# Patient Record
Sex: Female | Born: 1955
Health system: Southern US, Community
[De-identification: ages and names within clinical notes are randomized; demographics above are authoritative.]

## PROBLEM LIST (undated history)

## (undated) DIAGNOSIS — E119 Type 2 diabetes mellitus without complications: Secondary | ICD-10-CM

## (undated) DIAGNOSIS — H269 Unspecified cataract: Secondary | ICD-10-CM

## (undated) DIAGNOSIS — J189 Pneumonia, unspecified organism: Secondary | ICD-10-CM

## (undated) DIAGNOSIS — H539 Unspecified visual disturbance: Secondary | ICD-10-CM

## (undated) HISTORY — DX: Unspecified visual disturbance: H53.9

## (undated) HISTORY — DX: Unspecified cataract: H26.9

## (undated) HISTORY — DX: Pneumonia, unspecified organism: J18.9

## (undated) HISTORY — PX: CYST REMOVAL: SHX22

---

## 2018-09-27 DIAGNOSIS — L309 Dermatitis, unspecified: Secondary | ICD-10-CM | POA: Insufficient documentation

## 2018-10-08 ENCOUNTER — Other Ambulatory Visit (INDEPENDENT_AMBULATORY_CARE_PROVIDER_SITE_OTHER): Payer: Self-pay | Admitting: Family Medicine

## 2018-10-12 DIAGNOSIS — Z6841 Body Mass Index (BMI) 40.0 and over, adult: Secondary | ICD-10-CM | POA: Insufficient documentation

## 2018-10-16 ENCOUNTER — Other Ambulatory Visit: Payer: Self-pay | Admitting: Family Medicine

## 2018-10-23 ENCOUNTER — Other Ambulatory Visit: Payer: Self-pay | Admitting: Otolaryngology

## 2019-01-03 ENCOUNTER — Encounter (INDEPENDENT_AMBULATORY_CARE_PROVIDER_SITE_OTHER): Payer: Self-pay

## 2019-01-03 ENCOUNTER — Ambulatory Visit (INDEPENDENT_AMBULATORY_CARE_PROVIDER_SITE_OTHER): Payer: BLUE CROSS/BLUE SHIELD | Admitting: Family Medicine

## 2019-01-03 VITALS — BP 132/84 | HR 80 | Temp 98.3°F | Resp 16 | Ht 70.0 in | Wt 288.0 lb

## 2019-01-03 DIAGNOSIS — Z113 Encounter for screening for infections with a predominantly sexual mode of transmission: Secondary | ICD-10-CM

## 2019-01-03 DIAGNOSIS — N760 Acute vaginitis: Secondary | ICD-10-CM

## 2019-01-03 DIAGNOSIS — E1169 Type 2 diabetes mellitus with other specified complication: Secondary | ICD-10-CM | POA: Insufficient documentation

## 2019-01-03 DIAGNOSIS — E119 Type 2 diabetes mellitus without complications: Secondary | ICD-10-CM | POA: Insufficient documentation

## 2019-01-03 DIAGNOSIS — B9689 Other specified bacterial agents as the cause of diseases classified elsewhere: Secondary | ICD-10-CM

## 2019-01-03 MED ORDER — METRONIDAZOLE 500 MG PO TABS
500.00 mg | ORAL_TABLET | Freq: Two times a day (BID) | ORAL | 0 refills | Status: AC
Start: 2019-01-03 — End: 2019-01-10

## 2019-01-03 NOTE — Patient Instructions (Addendum)
Start Flagyl (Metornidizole) for you vaginal discharge which is likely due to bacterial vaginosis  Orders provided for STD testing.   We will call you with the results of the test.

## 2019-01-03 NOTE — Progress Notes (Signed)
[facility] [place] - [facility] [place] [facility]                       Date of Exam: 01/03/2019 12:57 PM        Patient ID: Gabriella Dean is a 63 y.o. female.  Attending Physician: PP Cory Roughen IN MD        Chief Complaint:    Chief Complaint   Patient presents with   . Vaginal Discharge               HPI:    63yo F with known T2DM and goiter presents with increased vaginal discharge that is a little bite thicker and slight foul odor compared to normal for the past 4-5 days. She has been treated with Flagyl prior for increased discharge and this is similar to that episode.   Her partner has been unfaithful, she is unsure when he was unfaithful.   Denies dysuria, dyspareunia, fatigue.   She would like STD testing.   Denies hepatic like lesions.     She has a pending COVID19 test, which was ordered for sweating. No cough, F/C or SOB.         ROS:    Review of Systems   Constitutional: Negative for fatigue.   Genitourinary: Positive for vaginal discharge. Negative for dyspareunia, dysuria, frequency, genital sores, pelvic pain, urgency and vaginal pain.                Problem List:    Patient Active Problem List   Diagnosis   . Type 2 diabetes mellitus with other specified complication, without long-term current use of insulin             Current Meds:    No outpatient medications have been marked as taking for the 01/03/19 encounter (Office Visit) with PP Cory Roughen IN MD.          Allergies:    Allergies   Allergen Reactions   . Chlorpheniramine Nausea And Vomiting and Rash   . Erythromycin Nausea And Vomiting and Rash   . Penicillins Nausea And Vomiting and Rash   . Tetracycline Nausea And Vomiting and Rash           Past Surgical History:    History reviewed. No pertinent surgical history.        Family History:    History reviewed. No pertinent family history.        Social History:    Social History     Tobacco Use   . Smoking status: Never Smoker   . Smokeless tobacco: Never Used    Substance Use Topics   . Alcohol use: Yes     Comment: socially   . Drug use: Not on file          The following sections were reviewed this encounter by the provider:   Tobacco  Allergies  Meds  Problems  Med Hx  Surg Hx  Fam Hx            Vital Signs:    BP 132/84   Pulse 80   Temp 98.3 F (36.8 C)   Resp 16   Ht 1.778 m (5\' 10" )   Wt 130.6 kg (288 lb)   BMI 41.32 kg/m             Physical Exam:    Physical Exam  Vitals signs and nursing note reviewed.   Constitutional:  General: She is not in acute distress.     Appearance: Normal appearance. She is not toxic-appearing.   HENT:      Head: Normocephalic and atraumatic.      Nose: Nose normal.      Mouth/Throat:      Mouth: Mucous membranes are moist.   Eyes:      General: No scleral icterus.        Right eye: No discharge.         Left eye: No discharge.      Extraocular Movements: Extraocular movements intact.      Conjunctiva/sclera: Conjunctivae normal.   Pulmonary:      Effort: Pulmonary effort is normal.   Neurological:      Mental Status: She is alert.   Psychiatric:         Mood and Affect: Mood normal.         Behavior: Behavior normal.         Thought Content: Thought content normal.         Judgment: Judgment normal.              Assessment:    1. Screening for venereal disease  - HIV-1/2 Ag/Ab 4th Gen. w/ Reflex  - Hepatitis C (HCV) antibody, Total  - RPR  - Chlamydia Gonorrhoeae NAA    2. Bacterial vaginosis  - metroNIDAZOLE (FLAGYL) 500 MG tablet; Take 1 tablet (500 mg total) by mouth 2 (two) times daily for 7 days  Dispense: 14 tablet; Refill: 0    3. Type 2 diabetes mellitus with other specified complication, without long-term current use of insulin            Plan:    Patient deferred pelvic exam, which I am fine with as I suspect Bacterial vaginosis v Trichomonas and management will not change with the exam.   Will treat her empirically with flagyl and STI testing.   Patient voiced understanding and agreement with the plan.   All questions answered.  A minimum of 25 minutes was spent with the patient and greater than 50% was spent coordinating care or counseling the patient.          Follow-up:    Return if symptoms worsen or fail to improve.         PP [facility] MD

## 2019-01-08 LAB — COMPREHENSIVE METABOLIC PANEL
ALT: 11 IU/L (ref 0–32)
AST (SGOT): 13 IU/L (ref 0–40)
Albumin/Globulin Ratio: 1.3 (ref 1.2–2.2)
Albumin: 4.4 g/dL (ref 3.8–4.8)
Alkaline Phosphatase: 86 IU/L (ref 39–117)
BUN / Creatinine Ratio: 14 (ref 12–28)
BUN: 9 mg/dL (ref 8–27)
Bilirubin, Total: 0.4 mg/dL (ref 0.0–1.2)
CO2: 23 mmol/L (ref 20–29)
Calcium: 10 mg/dL (ref 8.7–10.3)
Chloride: 99 mmol/L (ref 96–106)
Creatinine: 0.64 mg/dL (ref 0.57–1.00)
EGFR: 110 mL/min/{1.73_m2} (ref >59)
EGFR: 95 mL/min/{1.73_m2} (ref >59)
Globulin, Total: 3.3 g/dL (ref 1.5–4.5)
Glucose: 137 mg/dL — ABNORMAL HIGH (ref 65–99)
Potassium: 4.5 mmol/L (ref 3.5–5.2)
Protein, Total: 7.7 g/dL (ref 6.0–8.5)
Sodium: 139 mmol/L (ref 134–144)

## 2019-01-08 LAB — THYROID PEROXIDASE ANTIBODY: Thyroid Peroxidase (TPO) AB: 12 IU/mL (ref 0–34)

## 2019-01-08 LAB — THYROID SCREEN
T4, Free: 1.17 ng/dL (ref 0.82–1.77)
TSH: 0.526 u[IU]/mL (ref 0.450–4.500)

## 2019-01-08 LAB — HEMOGLOBIN A1C: Hemoglobin A1C: 8 % — ABNORMAL HIGH (ref 4.8–5.6)

## 2019-01-09 LAB — RPR: RPR: NONREACTIVE

## 2019-01-09 LAB — HEPATITIS C ANTIBODY: HCV AB: <0.1 s/co ratio (ref 0.0–0.9)

## 2019-01-09 LAB — CHLAMYDIA GONORRHOEAE NAA
CHLAMYDIA TRACHOMATIS, NAA: NEGATIVE
Neisseria gonorrhoeae, NAA: NEGATIVE

## 2019-01-09 LAB — HIV-1/2 AG/AB 4TH GEN. W/ REFLEX: HIV Screen 4th Generation wRfx: NONREACTIVE

## 2019-01-11 ENCOUNTER — Encounter (INDEPENDENT_AMBULATORY_CARE_PROVIDER_SITE_OTHER): Payer: Self-pay

## 2019-01-11 NOTE — Progress Notes (Signed)
Case discussed/reviewed in the immediate proximity of the resident visit.  Chart reviewed to include allergies, medications, and pertinent past medical history to the nature of the visit.  I have reviewed the described history and exam as described and concur with the note as written.  Based on our mutual discussion, I agree with the assessment and plan described.    Teletha Petrea, MD  Faculy Physician, [facility]

## 2019-01-25 ENCOUNTER — Ambulatory Visit (INDEPENDENT_AMBULATORY_CARE_PROVIDER_SITE_OTHER): Payer: BLUE CROSS/BLUE SHIELD | Admitting: Family Medicine

## 2019-02-20 ENCOUNTER — Ambulatory Visit (INDEPENDENT_AMBULATORY_CARE_PROVIDER_SITE_OTHER): Payer: BLUE CROSS/BLUE SHIELD | Admitting: Family Medicine

## 2019-03-09 ENCOUNTER — Encounter (INDEPENDENT_AMBULATORY_CARE_PROVIDER_SITE_OTHER): Payer: Self-pay | Admitting: Family Medicine

## 2019-03-11 ENCOUNTER — Encounter (INDEPENDENT_AMBULATORY_CARE_PROVIDER_SITE_OTHER): Payer: Self-pay

## 2019-03-11 ENCOUNTER — Ambulatory Visit (INDEPENDENT_AMBULATORY_CARE_PROVIDER_SITE_OTHER): Payer: BLUE CROSS/BLUE SHIELD | Admitting: Family Medicine

## 2019-03-11 ENCOUNTER — Encounter (INDEPENDENT_AMBULATORY_CARE_PROVIDER_SITE_OTHER): Payer: Self-pay | Admitting: Family Medicine

## 2019-03-11 VITALS — BP 140/78 | HR 84 | Temp 98.9°F | Resp 16 | Ht 70.0 in | Wt 290.0 lb

## 2019-03-11 DIAGNOSIS — Z1211 Encounter for screening for malignant neoplasm of colon: Secondary | ICD-10-CM

## 2019-03-11 DIAGNOSIS — Z23 Encounter for immunization: Secondary | ICD-10-CM

## 2019-03-11 DIAGNOSIS — E1169 Type 2 diabetes mellitus with other specified complication: Secondary | ICD-10-CM

## 2019-03-11 DIAGNOSIS — R030 Elevated blood-pressure reading, without diagnosis of hypertension: Secondary | ICD-10-CM

## 2019-03-11 DIAGNOSIS — Z1239 Encounter for other screening for malignant neoplasm of breast: Secondary | ICD-10-CM

## 2019-03-11 DIAGNOSIS — Z13220 Encounter for screening for lipoid disorders: Secondary | ICD-10-CM

## 2019-03-11 DIAGNOSIS — E049 Nontoxic goiter, unspecified: Secondary | ICD-10-CM

## 2019-03-11 LAB — POCT MICROALBUMIN: Microalbumin, POC: 10

## 2019-03-11 LAB — POCT HEMOGLOBIN A1C: POCT Hgb A1C: 6.4 % — AB (ref 3.9–5.9)

## 2019-03-11 NOTE — Progress Notes (Signed)
Subjective:      Patient ID: Gabriella Dean is a 63 y.o. female.    Chief Complaint:  Chief Complaint   Patient presents with   . Diabetes       HPI:  The patient presents for follow up.  Issues include:    1. Type 2 Diabetes. She reports that she's been "working hard" to try to lower her A1C by exercising more and by eating a healthier diet.  She denies polyuria, polydipsia or polyphagia.  She is currently not on any medications for her diabetes.    2.  Goiter.  She has a goiter and has had a fine needle aspirate earlier this year which was benign.  She reports that she has been having some hot flashes recently.  She states that the hot flashes got better after taking some B vitamins.  No heat or cold intolerance, no constipation or diarrhea.    She hasn't really taken care of herself for the last 5-10 years and has focused on taking care of others.  She hasn't had a mammogram or a pap smear in years.  She doesn't recall her last tetanus shot and she has never had a pneumovax.  She hasn't had a colonoscopy.        Problem List:  Patient Active Problem List   Diagnosis   . Type 2 diabetes mellitus with other specified complication, without long-term current use of insulin   . Goiter   . Eczema   . Body mass index (BMI) 40.0-44.9, adult       Current Medications:  No current outpatient medications on file.     No current facility-administered medications for this visit.        Allergies:  Allergies   Allergen Reactions   . Chlorpheniramine Nausea And Vomiting and Rash   . Erythromycin Nausea And Vomiting and Rash   . Penicillins Nausea And Vomiting and Rash   . Tetracycline Nausea And Vomiting and Rash       Past Medical History:  No past medical history on file.    Past Surgical History:  No past surgical history on file.    Family History:  Family History   Problem Relation Age of Onset   . COPD Mother         Smoker   . Hypertension Mother    . Cancer Father    . Diabetes Father    . Diabetes Sister    .  Diabetes Brother    . Stroke Brother    . Prostate cancer Brother    . Coronary artery disease Maternal Grandmother    . Lung cancer Maternal Grandfather    . Cerebral aneurysm Paternal Grandmother    . Diabetes Brother    . Heart disease Brother    . Stent Brother        Social History:  Social History     Socioeconomic History   . Marital status: Single     Spouse name: Not on file   . Number of children: Not on file   . Years of education: Not on file   . Highest education level: Not on file   Occupational History   . Not on file   Social Needs   . Financial resource strain: Not on file   . Food insecurity     Worry: Not on file     Inability: Not on file   . Transportation needs     Medical: Not  on file     Non-medical: Not on file   Tobacco Use   . Smoking status: Never Smoker   . Smokeless tobacco: Never Used   Substance and Sexual Activity   . Alcohol use: Yes     Comment: socially   . Drug use: Not on file   . Sexual activity: Not on file   Lifestyle   . Physical activity     Days per week: Not on file     Minutes per session: Not on file   . Stress: Not on file   Relationships   . Social Wellsite geologist on phone: Not on file     Gets together: Not on file     Attends religious service: Not on file     Active member of club or organization: Not on file     Attends meetings of clubs or organizations: Not on file     Relationship status: Not on file   . Intimate partner violence     Fear of current or ex partner: Not on file     Emotionally abused: Not on file     Physically abused: Not on file     Forced sexual activity: Not on file   Other Topics Concern   . Not on file   Social History Narrative   . Not on file       The following sections were reviewed this encounter by the provider:   Tobacco  Allergies  Meds  Problems  Med Hx  Surg Hx  Fam Hx         ROS:  Review of Systems   Constitutional: Negative for activity change, appetite change, fatigue and unexpected weight change.   HENT: Negative  for congestion and ear pain.    Eyes: Negative for visual disturbance.   Respiratory: Negative for chest tightness and shortness of breath.    Cardiovascular: Negative for chest pain, palpitations and leg swelling.   Gastrointestinal: Negative for abdominal pain, nausea and vomiting.   Endocrine: Negative for cold intolerance, heat intolerance, polydipsia, polyphagia and polyuria.   Musculoskeletal: Negative for arthralgias and myalgias.   Skin: Negative for rash.   Neurological: Negative for dizziness, weakness, light-headedness, numbness and headaches.   Psychiatric/Behavioral: Negative for dysphoric mood.       Vitals:  BP 140/78 (BP Site: Left arm, Patient Position: Sitting, Cuff Size: Medium)   Pulse 84   Temp 98.9 F (37.2 C)   Resp 16   Ht 1.778 m (5\' 10" )   Wt 131.5 kg (290 lb)   BMI 41.61 kg/m      Objective:     Physical Exam:  Physical Exam  Constitutional:       Appearance: She is well-developed and overweight.   HENT:      Head: Normocephalic and atraumatic.   Eyes:      Extraocular Movements: Extraocular movements intact.      Pupils: Pupils are equal, round, and reactive to light.      Funduscopic exam:     Right eye: No hemorrhage, exudate, AV nicking or arteriolar narrowing.         Left eye: No hemorrhage, exudate, AV nicking or arteriolar narrowing.   Neck:      Musculoskeletal: Normal range of motion.      Thyroid: No thyroid mass or thyromegaly.      Vascular: No carotid bruit or JVD.   Cardiovascular:      Rate  and Rhythm: Normal rate and regular rhythm.      Pulses: Normal pulses.      Heart sounds: Normal heart sounds. No murmur. No friction rub. No gallop.    Pulmonary:      Effort: Pulmonary effort is normal.      Breath sounds: No wheezing, rhonchi or rales.   Abdominal:      Palpations: Abdomen is soft.      Tenderness: There is no abdominal tenderness.   Musculoskeletal:      Right lower leg: No edema.      Left lower leg: No edema.      Right foot: No Charcot foot.      Left  foot: No Charcot foot.   Feet:      Right foot:      Skin integrity: Skin integrity normal.      Toenail Condition: Right toenails are normal.      Left foot:      Skin integrity: Skin integrity normal.      Toenail Condition: Left toenails are normal.   Lymphadenopathy:      Cervical: No cervical adenopathy.   Skin:     Findings: No rash.   Neurological:      Mental Status: She is alert and oriented to person, place, and time.      Cranial Nerves: Cranial nerves are intact.      Motor: Motor function is intact.      Deep Tendon Reflexes: Reflexes are normal and symmetric.      Comments: Normal monofilament test in feet bilaterally.   Psychiatric:         Mood and Affect: Mood and affect normal.          Assessment:     1. Type 2 diabetes mellitus with other specified complication, without long-term current use of insulin  - POCT microalbumin  - POCT Hemoglobin A1C    2. Screening for lipid disorders  - Lipid panel    3. Screening for breast cancer  - Mammo Digital Screening Bilateral W Cad; Future  - Mammo Digital Screening Bilateral W Cad    4. Immunization due  - Flu vaccine QUAD 6 mos & up PRESERVED  - Pneumococcal polysaccharide vaccine 23-valent greater than or equal to 2yo subcutaneous/IM    5. Screen for colon cancer  - Ambulatory referral to Gastroenterology    6. breast cancer screening  - Mammo Digital Screening Bilateral W Cad; Future  - Mammo Digital Screening Bilateral W Cad    7. Goiter  - TSH    8. Elevated blood pressure reading in office without diagnosis of hypertension      Plan:     1. Microalbumin done.  2. A1C done.  3. Mammogram ordered, referred for colonoscopy.  4. Flu and Pneumovax given.  5. Remainder of labs as above, patient will be contacted with results.  6. Follow up in 2-3 months for wellness with pap.  Will do tetanus vaccine at that time.    Doyle Askew, MD

## 2019-03-11 NOTE — Patient Instructions (Addendum)
You were given a flu shot and a pneumonia shot today.  Your A1C showed that your diabetes is under good control.  There was a small amount of protein in your urine.  We will recheck this in the future.  You were given an order for a mammogram and were referred for a colonoscopy.  Please follow up in 2-3 months for a wellness exam with a pap smear.  You'll be due to get a tetanus shot at that visit.

## 2019-03-12 ENCOUNTER — Encounter (INDEPENDENT_AMBULATORY_CARE_PROVIDER_SITE_OTHER): Payer: Self-pay

## 2019-03-12 ENCOUNTER — Telehealth (INDEPENDENT_AMBULATORY_CARE_PROVIDER_SITE_OTHER): Payer: Self-pay | Admitting: Family Medicine

## 2019-03-12 LAB — LIPID PANEL
Cholesterol / HDL Ratio: 3.6 ratio (ref 0.0–4.4)
Cholesterol: 250 mg/dL — ABNORMAL HIGH (ref 100–199)
HDL: 70 mg/dL (ref >39)
LDL Chol Calculated (NIH): 166 mg/dL — ABNORMAL HIGH (ref 0–99)
Triglycerides: 83 mg/dL (ref 0–149)
VLDL Calculated: 14 mg/dL (ref 5–40)

## 2019-03-12 LAB — TSH: TSH: 0.813 u[IU]/mL (ref 0.450–4.500)

## 2019-03-12 NOTE — Telephone Encounter (Signed)
[facility]P-no referral required-efaxed Dr. Nedra Hai 03/12/2019 NAH

## 2019-05-03 ENCOUNTER — Encounter (INDEPENDENT_AMBULATORY_CARE_PROVIDER_SITE_OTHER): Payer: Self-pay | Admitting: Family Medicine

## 2019-05-13 ENCOUNTER — Ambulatory Visit (INDEPENDENT_AMBULATORY_CARE_PROVIDER_SITE_OTHER): Payer: BLUE CROSS/BLUE SHIELD | Admitting: Family Medicine

## 2019-05-29 ENCOUNTER — Encounter (INDEPENDENT_AMBULATORY_CARE_PROVIDER_SITE_OTHER): Payer: Self-pay

## 2019-06-12 ENCOUNTER — Ambulatory Visit (INDEPENDENT_AMBULATORY_CARE_PROVIDER_SITE_OTHER): Payer: BLUE CROSS/BLUE SHIELD | Admitting: Family Medicine

## 2019-06-20 DIAGNOSIS — E042 Nontoxic multinodular goiter: Secondary | ICD-10-CM | POA: Insufficient documentation

## 2019-07-08 ENCOUNTER — Ambulatory Visit (INDEPENDENT_AMBULATORY_CARE_PROVIDER_SITE_OTHER): Payer: BLUE CROSS/BLUE SHIELD | Admitting: Family Medicine

## 2019-08-05 ENCOUNTER — Ambulatory Visit (INDEPENDENT_AMBULATORY_CARE_PROVIDER_SITE_OTHER): Payer: BLUE CROSS/BLUE SHIELD | Admitting: Family Medicine

## 2019-08-26 ENCOUNTER — Encounter (INDEPENDENT_AMBULATORY_CARE_PROVIDER_SITE_OTHER): Payer: Self-pay | Admitting: Family Medicine

## 2019-08-26 ENCOUNTER — Ambulatory Visit (INDEPENDENT_AMBULATORY_CARE_PROVIDER_SITE_OTHER): Payer: BLUE CROSS/BLUE SHIELD | Admitting: Family Medicine

## 2019-08-26 ENCOUNTER — Encounter (INDEPENDENT_AMBULATORY_CARE_PROVIDER_SITE_OTHER): Payer: Self-pay

## 2019-08-26 VITALS — BP 144/80 | HR 100 | Temp 96.0°F | Resp 17 | Ht 70.0 in | Wt 295.0 lb

## 2019-08-26 DIAGNOSIS — Z1211 Encounter for screening for malignant neoplasm of colon: Secondary | ICD-10-CM

## 2019-08-26 DIAGNOSIS — Z124 Encounter for screening for malignant neoplasm of cervix: Secondary | ICD-10-CM

## 2019-08-26 DIAGNOSIS — Z01419 Encounter for gynecological examination (general) (routine) without abnormal findings: Secondary | ICD-10-CM

## 2019-08-26 DIAGNOSIS — E1169 Type 2 diabetes mellitus with other specified complication: Secondary | ICD-10-CM

## 2019-08-26 DIAGNOSIS — M5431 Sciatica, right side: Secondary | ICD-10-CM

## 2019-08-26 DIAGNOSIS — E782 Mixed hyperlipidemia: Secondary | ICD-10-CM

## 2019-08-26 DIAGNOSIS — N950 Postmenopausal bleeding: Secondary | ICD-10-CM

## 2019-08-26 DIAGNOSIS — Z1231 Encounter for screening mammogram for malignant neoplasm of breast: Secondary | ICD-10-CM

## 2019-08-26 NOTE — Patient Instructions (Addendum)
Prevention Guidelines, Women Ages 44 to 74  Screening tests and vaccines are an important part of managing your health. A screening test is done to find possible disorders or diseases in people who don't have any symptoms. The goal is to find a disease early so lifestyle changes can be made and you can be watched more closely to reduce the risk of disease, or to detect it early enough to treat it most effectively. Screening tests are not considered diagnostic, but are used to determine if more testing is needed.Health counseling is essential, too. Below are guidelines for these, for women ages 54 to 30. Keep in mind that screening advice varies among expert groups. Talk with your healthcare provider about which tests are best for you and to make sure you're up to date on what you need.  Screening Who needs it How often   Type 2 diabetes or prediabetes All women beginning at age 35 and women without symptoms at any age who are overweight or obese and have 1 or more additional risk factors for diabetes. At least every 3 years   Type 2 diabetes or prediabetes All women diagnosed with gestational diabetes Lifelong testing at least every 3 years   Type 2 diabetes All women with prediabetes Every year   Unhealthy alcohol use All women in this age group At routine exams   Blood pressure All women in this age group Yearly checkup if your blood pressure is normal  Normal blood pressure is less than 120/80 mm Hg  If your blood pressure reading is higher than normal, follow the advice of your healthcare provider   Breast cancer All women at average risk in this age group Yearly mammogram should be done until age 37. At age 2, you can switch to every other year or choose to continue yearly.  All women should know how their breasts normally look and feel and know the possible benefits and risks of breast cancer screening with mammograms.   Cervical cancer All women in this age group, except women who have had a complete  hysterectomy Pap test every 3 years or Pap test with human papillomavirus (HPV) test every 5 years   Chlamydia Women who are sexually active and at increased risk for infection At yearly routine exams   Colorectal cancer All women at average risk in this age group Multiple tests are available and are used at different times. Possible tests include:   Flexible sigmoidoscopy every 5 years, or   Colonoscopy every 10 years, or   CT colonography (virtual colonoscopy) every 5 years, or   Yearly fecal occult blood test, or   Yearly fecal immunochemical test every year, or   Stool DNA test, every 3 years  If you choose a test other than a colonoscopy and have an abnormal test result, you will need to follow up with a colonoscopy. Screening advice varies among expert groups. Talk with your healthcare provider about which tests are best for you.  Some people should be screened using a different schedule because of their personal or family health history. Talk with your healthcare provider about your health history.   Depression All women in this age group At routine exams   Gonorrhea Sexually active women at increased risk for infection At yearly routine exams   Hepatitis C Anyone at increased risk; 1 time for those born between [place] At routine exams   High cholesterol or triglycerides All women in this age group who are at  risk for coronary artery disease At least every 5 years; talk with your healthcare provider about your risk   HIV All women At least once during your lifetime; yearly if at high risk   Lung cancer Women between the ages of 26 to 33 who are in fairly good health and are at higher risk for lung cancer     Currently smoke or have  quit within past 15 years     30-pack-year smoking history  , Eligibility criteria and age limit (possibly up to age 47) may vary across major organizations Yearly lung cancer screening with a low-dose CT scan (LDCT) Talk with your healthcare provider for more  information.   Obesity All women in this age group At yearly routine exams   Osteoporosis Women who are postmenopausal Talk with your healthcare provider   Syphilis Women at increased risk for infection At routine exams; talk with your healthcare provider   Tuberculosis Women at increased risk for infection Talk with your healthcare provider   Vision All women in this age group Talk with your healthcare provider   Vaccine Who needs it How often   Chickenpox (varicella) All women in this age group who have no record of this infection or vaccine 2 doses; the second dose should be given at least 4 weeks after the first dose   Hepatitis A Women at increased risk for infection 2 or 3 doses (depending on the vaccine) given at least 6 months apart; talk with your healthcare provider   Hepatitis B Women at increased risk for infection 2 or 3 doses (depending on the vaccine) ; second dose should be given 1 month after the first dose; if a third dose, it should be given at least 2 months after the second dose and at least 4 months after the first dose; talk with your healthcare provider   Haemophilus influenzae Type B (HIB) Women at increased risk for infection 1 or 3 doses; talk with your healthcare provider   Influenza (flu) All women in this age group Once a year   Measles, mumps, rubella (MMR) Women in this age group born in [birth year] or later who have no record of these infections or vaccines 1 or 2doses   Meningococcal Women at increased risk for infection 1 or more doses; talk with your healthcare provider   Pneumococcal conjugate vaccine (PCV13) and pneumococcal polysaccharide vaccine (PPSV23) Women at increased risk for infection PCV13: 1 dose ages 19 to 89 (protects against 13 types of pneumococcal bacteria)  PPSV23: 1 or 2 doses through age 72(protects against 23 types of pneumococcal bacteria)  Talk with your healthcare provider   Tetanus/diphtheria/pertussis (Td/Tdap) booster All women in this age group Td every 10  years, or a 1-time dose of Tdap instead of a Td booster after age 19, then Td every 10 years   Zoster (Shingles) All women ages 67 and older 2 vaccines are available:     Recombinant zoster vaccine (RZV; Shingrix) is recommended as the preferred shingles vaccine. It's given in a series of 2 doses The 2nd dose is given 2 to 6 months after the first. This is given even if you've had shingles before or or had a previous Zoster live vaccine (ZVL; Zostavax).   Zoster live vaccine (ZVL; Zostavax)may be given to healthy adults over age 12. It's given in one dose     Counseling Who needs it How often   BRCA gene mutation testing for breast and ovarian cancer susceptibility Women with  increased risk for having gene mutation When your risk is known; talk with your healthcare provider   Breast cancer and chemoprevention Women at high risk for breast cancer When your risk is known; talk with your healthcare provider   Diet and exercise Women who are overweight or obese When diagnosed, and then at routine exams   Sexually transmitted infection prevention Women at increased risk for infection At routine exams; talk with your healthcare provider   Use of daily aspirin Women ages 32 and up who are at high risk for cardiovascular health problems and not at increased risk for bleeding as identified by their healthcare provider When your risk is known; talk with your healthcare provider   Use of tobacco and the health effects it can cause All women in this age group Every exam   [employer] last reviewed this educational content on 11/12/2018   2000-2020 [employer], [place]. [address], [place], [place] [address]. All rights reserved. This information is not intended as a substitute for professional medical care. Always follow your healthcare professional's instructions.    You were given an order for a mammogram and a referral to a GI doctor for a colonoscopy.  I'm concerned about your vaginal bleeding.  An ultrasound was  ordered to further evaluate this.  Please have this done at your earliest convenience.  You were referred to physical therapy for your hip and your legs.  Labs were ordered to check your cholesterol and your blood sugar.

## 2019-08-26 NOTE — Progress Notes (Signed)
[facility]  - [place] [facility]                       Date of Exam: 08/26/2019 8:51 AM        Patient ID: Gabriella Dean is a 64 y.o. female.  Attending Physician: Doyle Askew, MD        Chief Complaint:    Chief Complaint   Patient presents with   . Annual Exam     right hip pain                HPI:    The patient presents for a routine wellness exam.  She lost her mammogram order and need another one.  Her last period was "years" ago, but she had some vaginal spotting in December of 2020 and in January of this year.  No cramping.    She's following up with surgery and endocrinology regarding her goiter,which has grown.  She is likely having a thyroidectomy within the next few months.      She's been watching her diet and feels as though she's lost weight, though our scales say she's gained 5 pounds.  She states that her last pap was in about 2005.  No history of abnormal pap in the past.     Her only other concern is some pain in her right hip that worse when going up and down stairs.  She feels that her legs "aren't as strong as they should be" and that she has to use her arms to get up from her chair.  No numbness or tingling.              Problem List:    Patient Active Problem List   Diagnosis   . Type 2 diabetes mellitus with other specified complication, without long-term current use of insulin   . Eczema   . Body mass index (BMI) 40.0-44.9, adult   . Multinodular goiter             Current Meds:    No outpatient medications have been marked as taking for the 08/26/19 encounter (Office Visit) with Omar Person Durenda Hurt, MD.          Allergies:    Allergies   Allergen Reactions   . Ampicillin    . Chlorpheniramine Nausea And Vomiting and Rash   . Erythromycin Nausea And Vomiting and Rash   . Penicillins Nausea And Vomiting and Rash   . Tetracycline Nausea And Vomiting and Rash             Past Surgical History:    History reviewed. No pertinent surgical history.         Family History:    Family History   Problem Relation Age of Onset   . COPD Mother         Smoker   . Hypertension Mother    . Lung disease Mother         chronic obstructive lung disease; smoker   . Cancer Father         tumor in chest, unknown type   . Diabetes Father    . Diabetes Sister    . Diabetes Brother    . Stroke Brother    . Prostate cancer Brother    . Coronary artery disease Maternal Grandmother    . Lung cancer Maternal Grandfather    . Cancer Maternal Grandfather  unknown type   . Cerebral aneurysm Paternal Grandmother    . Diabetes Brother    . Heart disease Brother    . Stent Brother            Social History:    Social History     Tobacco Use   . Smoking status: Never Smoker   . Smokeless tobacco: Never Used   Substance Use Topics   . Alcohol use: Yes     Comment: socially   . Drug use: Not on file          The following sections were reviewed this encounter by the provider:   Tobacco  Allergies  Meds  Problems  Med Hx  Surg Hx  Fam Hx             Vital Signs:    BP 144/80   Pulse 100   Temp (!) 96 F (35.6 C) (Temporal)   Resp 17   Ht 1.778 m (5\' 10" )   Wt 133.8 kg (295 lb)   BMI 42.33 kg/m          ROS:    Review of Systems   Constitutional: Negative for appetite change, fatigue and unexpected weight change.   HENT: Negative for congestion, ear pain, hearing loss, sore throat and trouble swallowing.    Eyes: Negative for visual disturbance.   Respiratory: Negative for chest tightness and shortness of breath.    Cardiovascular: Negative for chest pain, palpitations and leg swelling.   Gastrointestinal: Negative for abdominal distention, abdominal pain, blood in stool, constipation, diarrhea and nausea.   Endocrine: Negative for cold intolerance, heat intolerance, polydipsia, polyphagia and polyuria.   Genitourinary: Positive for vaginal bleeding. Negative for difficulty urinating and frequency.   Musculoskeletal: Positive for arthralgias. Negative for back pain and myalgias.    Skin: Negative for rash.   Allergic/Immunologic: Negative for environmental allergies.   Neurological: Negative for dizziness, light-headedness and headaches.   Hematological: Negative for adenopathy. Does not bruise/bleed easily.   Psychiatric/Behavioral: Negative for dysphoric mood and sleep disturbance. The patient is not nervous/anxious.               Physical Exam:    Physical Exam  Constitutional:       Appearance: Normal appearance.   HENT:      Head: Normocephalic and atraumatic.      Right Ear: Tympanic membrane and ear canal normal.      Left Ear: Tympanic membrane and ear canal normal.   Eyes:      Extraocular Movements: Extraocular movements intact.      Conjunctiva/sclera: Conjunctivae normal.      Pupils: Pupils are equal, round, and reactive to light.   Neck:      Musculoskeletal: Normal range of motion.      Thyroid: No thyroid mass or thyromegaly.      Vascular: No carotid bruit or JVD.   Cardiovascular:      Rate and Rhythm: Normal rate and regular rhythm.      Pulses: Normal pulses.      Heart sounds: No murmur.   Pulmonary:      Effort: Pulmonary effort is normal.      Breath sounds: No wheezing, rhonchi or rales.   Chest:      Breasts:         Right: Normal.         Left: Normal.   Abdominal:      General: Abdomen is flat. Bowel sounds  are normal.      Palpations: Abdomen is soft.      Tenderness: There is no abdominal tenderness.   Genitourinary:     General: Normal vulva.      Exam position: Lithotomy position.      Pubic Area: No rash.       Labia:         Right: No lesion.         Left: No lesion.       Urethra: No urethral lesion.      Vagina: Normal.      Cervix: Cervical bleeding (with small amount of blood per os) present.      Uterus: Normal.       Adnexa: Right adnexa normal and left adnexa normal.   Musculoskeletal: Normal range of motion.      Right lower leg: No edema.      Left lower leg: No edema.      Comments: Tenderness over right sciatic notch   Lymphadenopathy:       Cervical: No cervical adenopathy.      Upper Body:      Right upper body: No axillary adenopathy.      Left upper body: No axillary adenopathy.   Skin:     General: Skin is warm and dry.      Findings: No lesion.   Neurological:      General: No focal deficit present.      Mental Status: She is alert and oriented to person, place, and time.      Cranial Nerves: Cranial nerves are intact.      Sensory: Sensation is intact.      Motor: Motor function is intact.      Gait: Gait is intact.      Deep Tendon Reflexes: Reflexes are normal and symmetric.   Psychiatric:         Mood and Affect: Mood normal.              Assessment:    1. Well woman exam with routine gynecological exam    2. Screen for colon cancer  - Referral to Gastroenterology - EXTERNAL    3. Encounter for screening mammogram for malignant neoplasm of breast  - Mammo Digital Screening Bilateral W Cad    4. Postmenopausal bleeding  - US Pelvis with Transvaginal    5. Type 2 diabetes mellitus with other specified complication, without long-term current use of insulin  - Hemoglobin A1C    6. Mixed hyperlipidemia  - Lipid panel    7. Sciatica of right side  - Referral to Physical Therapy - EXTERNAL    8. Screening for malignant neoplasm of cervix  - Pap IG, Aptima HPV,rfx 16/18,45            Plan:    Health Maintenance:  1. Recommend 5 servings of fruits and vegetables daily as part of a diet that also includes dairy, whole grains and lean meats with limited red meat.    2. Recommend 150 minutes of aerobic exercise per week or 10,000 steps daily.    3. Labs as recorded, patient will be contacted with results.    4. Mammogram re-ordered.    5. Referred for colonoscopy.    6. Referred to PT for evaluation of sciatica.    7. Pelvic ultrasound ordered to assess endometrial stripe.          Follow-up:    Return in about 6 months (around 02/26/2020) for  Diabetes follow up.         Doyle Askew, MD

## 2019-08-27 LAB — LIPID PANEL
Cholesterol / HDL Ratio: 3.5 ratio (ref 0.0–4.4)
Cholesterol: 258 mg/dL — ABNORMAL HIGH (ref 100–199)
HDL: 74 mg/dL (ref >39)
LDL Chol Calculated (NIH): 175 mg/dL — ABNORMAL HIGH (ref 0–99)
Triglycerides: 57 mg/dL (ref 0–149)
VLDL Calculated: 9 mg/dL (ref 5–40)

## 2019-08-27 LAB — HEMOGLOBIN A1C: Hemoglobin A1C: 7.5 % — ABNORMAL HIGH (ref 4.8–5.6)

## 2019-08-31 LAB — PAP IG, APTIMA HPV W/ RFX 16 AND 18,45 FOR HR SPEC
.: 0
HPV APTIMA: NEGATIVE

## 2019-09-12 ENCOUNTER — Telehealth (INDEPENDENT_AMBULATORY_CARE_PROVIDER_SITE_OTHER): Payer: BLUE CROSS/BLUE SHIELD | Admitting: Family Medicine

## 2019-09-12 ENCOUNTER — Encounter (INDEPENDENT_AMBULATORY_CARE_PROVIDER_SITE_OTHER): Payer: Self-pay | Admitting: Family Medicine

## 2019-09-12 VITALS — Temp 97.9°F

## 2019-09-12 DIAGNOSIS — N83201 Unspecified ovarian cyst, right side: Secondary | ICD-10-CM

## 2019-09-12 DIAGNOSIS — E1169 Type 2 diabetes mellitus with other specified complication: Secondary | ICD-10-CM

## 2019-09-12 DIAGNOSIS — E782 Mixed hyperlipidemia: Secondary | ICD-10-CM | POA: Insufficient documentation

## 2019-09-12 DIAGNOSIS — N644 Mastodynia: Secondary | ICD-10-CM

## 2019-09-12 NOTE — Progress Notes (Addendum)
[facility] [place] - [place] PARTNER                       Date of Virtual Visit: 09/12/2019 9:07 AM        Patient ID: Gabriella Dean is a 64 y.o. female.  Attending Physician: Doyle Askew, MD       Telemedicine Eligibility:    State Location:  [x]  [place]  []  [place]  []  [place] []  [place]  []  Other:    Physical Location:  [x]  Home  []         []        []          []  Other:    Patient Identity Verification:  []  State Issued ID  []  Insurance Eligibility Check  [x]  Other: Patient known to provider    Physical Address Verification: (for 911)  [x]  Yes  []  No    Personal identity shared with patient:  [x]  Yes  []  No      Visit terminated since not appropriate for virtual care:  [x]  N/A  []  Reason:    Visit conducted via audio and video         Chief Complaint:    Chief Complaint   Patient presents with   . Labs Only     f/u               HPI:    The patient presents for follow up.  Issues include:    1.  Type 2 Diabetes.  Her most recent A1C was slightly higher than it had been previously at 7.5%.  She notes that she's making a good effort to cut out carbs and to eat more lean meat and fish, particularly salmon.  She notes that she's not exercising as much as she could and is planning to increase from 6,000 to 10,000 steps per day.  She has a very strong family history of diabetes.  She's hoping to avoid taking medications if possible.  She's been taking gummy vitamins and notes that they have a fair amount of sugar.    2.  Hyperlipidemia.  Her most recent total cholesterol and LDL were elevated, though her HDL was good.  Her overall 10 year risk of heart attack or stroke was calculated at 11.9%.  She's hoping to avoid medication by implementing the lifestyle changes outlined above.    3. Ovarian cyst.  She had a recent pelvic ultrasound as part of a workup for vaginal bleeding.  Her endometrial stripe was normal, but a 3.5 cm right complex ovarian cyst was noted.       4.  Breast pain.  She had a recent mammogram done which was normal, but she notes some intermittent pain in her breasts around the time of the month that she used to have her period.  She does drink coffee and tea with caffeine several times per day.                Problem List:    Patient Active Problem List   Diagnosis   . Type 2 diabetes mellitus with other specified complication, without long-term current use of insulin   . Eczema   . Body mass index (BMI) 40.0-44.9, adult   . Multinodular goiter             Current Meds:    No outpatient medications have been marked as taking for the 09/12/19 encounter (  Telemedicine Visit) with Doyle Askew, MD.          Allergies:    Allergies   Allergen Reactions   . Ampicillin    . Chlor-Trimeton [Chlorpheniramine] Nausea And Vomiting and Rash   . Erythromycin Nausea And Vomiting and Rash   . Penicillins Nausea And Vomiting and Rash   . Tetracycline Nausea And Vomiting and Rash             Past Surgical History:    No past surgical history on file.        Family History:    Family History   Problem Relation Age of Onset   . COPD Mother         Smoker   . Hypertension Mother    . Lung disease Mother         chronic obstructive lung disease; smoker   . Cancer Father         tumor in chest, unknown type   . Diabetes Father    . Diabetes Sister    . Diabetes Brother    . Stroke Brother    . Prostate cancer Brother    . Coronary artery disease Maternal Grandmother    . Lung cancer Maternal Grandfather    . Cancer Maternal Grandfather         unknown type   . Cerebral aneurysm Paternal Grandmother    . Diabetes Brother    . Heart disease Brother    . Stent Brother            Social History:    Social History     Tobacco Use   . Smoking status: Never Smoker   . Smokeless tobacco: Never Used   Substance Use Topics   . Alcohol use: Yes     Comment: socially   . Drug use: Never          The following sections were reviewed this encounter by the provider:            Vital  Signs:    Temp 97.9 F (36.6 C)          ROS:    Review of Systems   Constitutional: Negative for unexpected weight change.   Cardiovascular: Positive for chest pain (in breasts as per HPI).              Physical Exam:    Physical Exam   GENERAL APPEARANCE: alert, in no acute distress, pleasant, well nourished.   HEAD: normal appearance  EYES: no discharge  NECK/THYROID: appearance -supple.  Thyroid enlarged  PSYCH: appropriate affect, appropriate mood, normal speech, normal attention        Assessment:    1. Type 2 diabetes mellitus with other specified complication, without long-term current use of insulin    2. Mixed hyperlipidemia    3. Breast pain    4. Right ovarian cyst            Plan:      1. Type 2 Diabetes.  Mildly exacerbated.  As A1C is under 8, agree with trial of lifestyle modification for a few more months.  2. Hyperlipidemia.  10 year risk discussed with patient. She would like to avoid medication, so agree with aggressive lifestyle modification for a few more months, but will need to consider medication if cholesterol does not reach goal.  3. Breast pain.  Recommend decreasing caffeine to see if this improves symptoms.  4. Ovarian cyst.  Referred  to gynecology.    Time spent in chart review, conducting visit and documenting visit was 33 minutes.          Follow-up:    Return in about 3 months (around 12/12/2019) for Diabetes follow up.         Doyle Askew, MD

## 2019-09-12 NOTE — Patient Instructions (Addendum)
Please continue your good work with decreasing the carbohydrates, fat and cholesterol in your diet and with increasing your exercise.  We should recheck your labs in 3 months to make sure we're trending in the right direction.  I'd recommend finding a different vitamin that has less sugar than your current gummy vitamins.    I'd recommend cutting down on your caffeine to see if this helps with your breast discomfort, but if not please let me know.    I'd recommend that you see a gynecologist about the cyst on your right ovary.  Please let me know when you're seeing them so that I can forward the records.    Please follow up in 3 months for a recheck.

## 2019-10-30 ENCOUNTER — Telehealth (INDEPENDENT_AMBULATORY_CARE_PROVIDER_SITE_OTHER): Payer: Self-pay | Admitting: Family Medicine

## 2019-10-30 NOTE — Telephone Encounter (Signed)
FEP-no referral required-faxed Dr. Kern Alberta 10/30/19    FEP-no referral required-faxed Dr. Aron Baba 10/30/19  Locations for   Dr. Aron Baba  [address]  [address]  [place], [place] [address]  [phone]  [phone]

## 2020-10-07 ENCOUNTER — Encounter (INDEPENDENT_AMBULATORY_CARE_PROVIDER_SITE_OTHER): Payer: Self-pay

## 2020-10-19 ENCOUNTER — Encounter (INDEPENDENT_AMBULATORY_CARE_PROVIDER_SITE_OTHER): Payer: Self-pay | Admitting: Family Medicine

## 2020-11-03 ENCOUNTER — Ambulatory Visit: Payer: BLUE CROSS/BLUE SHIELD

## 2020-11-03 NOTE — Pre-Procedure Instructions (Signed)
Important Instructions Before Your Procedure        Your case is currently scheduled for 11/12/2020 at [facility] OR with O'REILLY, ASHLEY G, ROESER, MICHELLE MARIE at 12:30 PM.    Your surgery is scheduled at [facility]. Please arrive 2 hours early to your scheduled surgery time.  Complimentary valet parking is available or you may choose to self park in the [employer]. Kindly call [phone] if you are lost or running late on the day of surgery.  On this map, the Surgery Center is #3   [email or web address]    On 11/10/20, your appointment is scheduled at 9:15 AM at the [facility].  The [facility] clinic is located at [facility] at the [facility] Building Lobby.  Please park in the [address] garage.  Once inside the building, please proceed down the hallway, past the coffee stand. On this map, the [facility] is #1 [email or web address].  If you need to reach the [facility] clinic directly, kindly call [phone].     Please call Pre-Surgical Services at [phone] with any questions regarding our phone interview today.     The date and/or time of your surgery may change.  Your surgeon's office will notify you, up until the business day before surgery, if there is any change to your surgery date or time.  Please don't hesitate to call your surgeon's office directly with any questions.        IMPORTANT You must visit the Preparing for Your Procedure guide link below for additional instructions including fasting guidelines and directions before your procedure. If you received fasting or skin preparation instructions from your surgeon or pre-procedural provider, please follow those specific instructions. The instructions here are general instructions that do not pertain to all  patients.  [email or web address]    If the link doesn't open, please copy and paste in your browser

## 2020-11-03 NOTE — PSS Phone Screening (Signed)
Pre-Anesthesia Evaluation    Pre-op phone visit requested by:   Reason for pre-op phone visit: Patient anticipating TOTAL THYROIDECTOMY procedure.    History of Present Illness/Summary:        Problem List:  Medical Problems             Hospital Problem List  Date Reviewed: 09/12/2019   None           Non-Hospital Problem List  Date Reviewed: 09/12/2019          ICD-10-CM Priority Class Noted    Type 2 diabetes mellitus with other specified complication, without long-term current use of insulin E11.69   01/03/2019    Eczema L30.9   09/27/2018    Body mass index (BMI) 40.0-44.9, adult Z68.41   10/12/2018    Multinodular goiter E04.2   06/20/2019    Mixed hyperlipidemia E78.2   09/12/2019              Medical History   Diagnosis Date   . Abnormal vision     wears glasses   . Bilateral cataracts     beginnings   . Pneumonia     07/1998 not hospitalized     Past Surgical History:   Procedure Laterality Date   . D & C  1976    bleeding   . D & C  1988    miscarriage   . TONSILLECTOMY AND ADENOIDECTOMY  1967       Current Outpatient Medications:   .  APPLE CIDER VINEGAR PO, Take by mouth every morning Takes 4 gummies at time, Disp: , Rfl:   .  Ascorbic Acid (vitamin C) 250 MG tablet, Take 500 mg by mouth every morning 2 250 mg in AM, total 500 mg, Disp: , Rfl:   .  B Complex Vitamins (B COMPLEX VITAMIN PO), Take by mouth every morning, Disp: , Rfl:   .  Vitamin D, Cholecalciferol, 50 MCG (2000 UT) Cap, Take 4,000 IU by mouth every morning, Disp: , Rfl:      Medication List          Accurate as of Nov 03, 2020 10:45 AM. Always use your most recent med list.            APPLE CIDER VINEGAR PO  Take by mouth every morning Takes 4 gummies at time  Medication Adjustments for Surgery: Stop 7 days before surgery     B COMPLEX VITAMIN PO  Take by mouth every morning  Medication Adjustments for Surgery: Hold day of surgery     vitamin C 250 MG tablet  Take 500 mg by mouth every morning 2 250 mg in AM, total 500 mg  Medication Adjustments for  Surgery: Hold day of surgery     Vitamin D (Cholecalciferol) 50 MCG (2000 UT) Caps  Take 4,000 IU by mouth every morning  Medication Adjustments for Surgery: Hold day of surgery          Allergies   Allergen Reactions   . Ampicillin Nausea And Vomiting and Rash   . Chlor-Trimeton [Chlorpheniramine] Nausea And Vomiting and Rash   . Erythromycin Nausea And Vomiting and Rash   . Penicillins Nausea And Vomiting and Rash   . Phenergan [Promethazine] Nausea And Vomiting   . Tetracycline Nausea And Vomiting and Rash     Family History   Problem Relation Age of Onset   . COPD Mother         Smoker   .  Hypertension Mother    . Lung disease Mother         chronic obstructive lung disease; smoker   . Cancer Father         tumor in chest, unknown type   . Diabetes Father    . Diabetes Sister    . Diabetes Brother    . Stroke Brother    . Prostate cancer Brother    . Coronary artery disease Maternal Grandmother    . Lung cancer Maternal Grandfather    . Cancer Maternal Grandfather         unknown type   . Cerebral aneurysm Paternal Grandmother    . Diabetes Brother    . Heart disease Brother    . Stent Brother      Social History     Occupational History   . Not on file   Tobacco Use   . Smoking status: Never Smoker   . Smokeless tobacco: Never Used   Vaping Use   . Vaping Use: Never used   Substance and Sexual Activity   . Alcohol use: Yes     Comment: socially   . Drug use: Never   . Sexual activity: Not on file       Menstrual History:   LMP / Status  Postmenopausal     No LMP recorded. Patient is postmenopausal.    Tubal Ligation?  No valid surgical or medical questions entered.             Exam Scores:   SDB score Risk Category: No Risk    PONV score Nausea Risk: SEVERE RISK    MST score MST Score: 0    Allergy score Risk Category: Moderate Risk    Frailty score         Visit Vitals  Ht 1.803 m (5\' 11" )   Wt 119.7 kg (264 lb)   BMI 36.82 kg/m

## 2020-11-03 NOTE — Nursing Progress Note (Signed)
Patient has been scheduled for NP evaluation on 11/10/20 for history and physical, labs, EKG and COVID testing prior to surgery on 11/12/20.

## 2020-11-10 ENCOUNTER — Encounter: Payer: Self-pay | Admitting: Physician Assistant

## 2020-11-10 ENCOUNTER — Encounter: Payer: Self-pay | Admitting: Otolaryngology

## 2020-11-10 ENCOUNTER — Ambulatory Visit: Payer: BLUE CROSS/BLUE SHIELD | Attending: Otolaryngology

## 2020-11-10 DIAGNOSIS — E042 Nontoxic multinodular goiter: Secondary | ICD-10-CM | POA: Insufficient documentation

## 2020-11-10 DIAGNOSIS — Z01818 Encounter for other preprocedural examination: Secondary | ICD-10-CM | POA: Insufficient documentation

## 2020-11-10 LAB — COMPREHENSIVE METABOLIC PANEL
ALT: 17 U/L (ref 0–55)
AST (SGOT): 17 U/L (ref 5–34)
Albumin/Globulin Ratio: 0.9 (ref 0.9–2.2)
Albumin: 3.6 g/dL (ref 3.5–5.0)
Alkaline Phosphatase: 78 U/L (ref 37–117)
Anion Gap: 8 (ref 5.0–15.0)
BUN: 10 mg/dL (ref 7.0–19.0)
Bilirubin, Total: 0.6 mg/dL (ref 0.2–1.2)
CO2: 28 mEq/L (ref 22–29)
Calcium: 9.8 mg/dL (ref 8.5–10.5)
Chloride: 101 mEq/L (ref 100–111)
Creatinine: 0.7 mg/dL (ref 0.6–1.0)
Globulin: 3.8 g/dL — ABNORMAL HIGH (ref 2.0–3.6)
Glucose: 151 mg/dL — ABNORMAL HIGH (ref 70–100)
Potassium: 4.1 mEq/L (ref 3.5–5.1)
Protein, Total: 7.4 g/dL (ref 6.0–8.3)
Sodium: 137 mEq/L (ref 136–145)

## 2020-11-10 LAB — COVID-19 (SARS-COV-2): SARS CoV 2 Overall Result: NOT DETECTED

## 2020-11-10 LAB — CBC AND DIFFERENTIAL
Absolute NRBC: 0 10*3/uL (ref 0.00–0.00)
Basophils Absolute Automated: 0.03 10*3/uL (ref 0.00–0.08)
Basophils Automated: 0.6 %
Eosinophils Absolute Automated: 0.04 10*3/uL (ref 0.00–0.44)
Eosinophils Automated: 0.8 %
Hematocrit: 45.3 % — ABNORMAL HIGH (ref 34.7–43.7)
Hgb: 14.7 g/dL (ref 11.4–14.8)
Immature Granulocytes Absolute: 0.01 10*3/uL (ref 0.00–0.07)
Immature Granulocytes: 0.2 %
Lymphocytes Absolute Automated: 2.45 10*3/uL (ref 0.42–3.22)
Lymphocytes Automated: 50.8 %
MCH: 29.1 pg (ref 25.1–33.5)
MCHC: 32.5 g/dL (ref 31.5–35.8)
MCV: 89.5 fL (ref 78.0–96.0)
MPV: 10.4 fL (ref 8.9–12.5)
Monocytes Absolute Automated: 0.44 10*3/uL (ref 0.21–0.85)
Monocytes: 9.1 %
Neutrophils Absolute: 1.85 10*3/uL (ref 1.10–6.33)
Neutrophils: 38.5 %
Nucleated RBC: 0 /100 WBC (ref 0.0–0.0)
Platelets: 288 10*3/uL (ref 142–346)
RBC: 5.06 10*6/uL (ref 3.90–5.10)
RDW: 12 % (ref 11–15)
WBC: 4.82 10*3/uL (ref 3.10–9.50)

## 2020-11-10 LAB — TYPE AND SCREEN
AB Screen Gel: NEGATIVE
ABO Rh: O POS

## 2020-11-10 LAB — HEMOGLOBIN A1C
Average Estimated Glucose: 191.5 mg/dL
Hemoglobin A1C: 8.3 % — ABNORMAL HIGH (ref 4.6–5.9)

## 2020-11-10 LAB — ECG 12-LEAD
Atrial Rate: 75 {beats}/min
IHS MUSE NARRATIVE AND IMPRESSION: NORMAL
P Axis: 61 degrees
P-R Interval: 148 ms
Q-T Interval: 398 ms
QRS Duration: 84 ms
QTC Calculation (Bezet): 444 ms
R Axis: -43 degrees
T Axis: 5 degrees
Ventricular Rate: 75 {beats}/min

## 2020-11-10 LAB — GFR: EGFR: >60

## 2020-11-10 NOTE — Discharge Instructions (Signed)
[place] [facility] PRE-PROCEDURAL EVALUATION CLINIC AFTER VISIT SUMMARY     Gabriella Dean, Gabriella Dean  DOB:  04-15-1956  MRN:  [id number]     You were seen 11/10/20 by Girtha Hake PA-C, an [facility], regarding your TOTAL THYROIDECTOMY.     Your procedure will take place at [facility]  [facility] TOWER MAIN PERIOPERATIVE  [address] [place] [address]  Loc: [phone]    Your case is currently scheduled for 11/12/2020 at 1230 with Krystal Clark, MD.  You will need to arrive two hours prior to the scheduled start time.     The date and/or time of your surgery may change.  Your surgeon's office will notify you, up until the business day before surgery, if there is any change to your surgery date or time.  Please don't hesitate to call your surgeon's office directly with any questions.    Fasting Instructions for Procedures Requiring Anesthesia or Sedation     If you received preoperative diet instructions from your surgeon or surgeon's office, please follow those specific instructions. The instructions here are general instructions that do not pertain to all patients.    Solids Clear Liquids   or Ice Chips Breast Milk Infant   Formula Non-Human Milk   No solids for 8 hours prior to   your scheduled procedure time. You may have "clear" liquids or ice chips up to 2 hours prior to your specified arrival time.    Examples of clear liquids include water, apple juice, sports drinks such as Gatorade, coffee or tea without cream or milk. Sugar or sweetener may be added. Feeding must end 4 hours prior to   the scheduled procedure time.    DO NOT add cereal or thickeners. Feeding must end 6 hours prior to   the scheduled procedure time.    DO NOT add cereal or thickeners. Feeding must end 8 hours prior to   the scheduled procedure time.    DO NOT add cereal or thickeners.     You may park in the [employer] or use the [employer] - both of these are complimentary for out-patient  procedures.  Be sure to bring your photo ID and insurance card with you.      If you are running late on the day of your procedure, call the OR directly at [phone].    If you need to cancel or postpone your procedure, please call your surgeon's office immediately.      MEDICATION INSTRUCTIONS  Below are the instructions reviewed with you today regarding your medications, along with any specific instructions or restrictions prior to your procedure.  In addition to these instructions, remember that you should avoid vitamins, supplements, herbal and green teas and over-the-counter pain medications (ibuprofen, naproxen, etc.) for at least 7 days prior to your surgery.  If you take aspirin, please consult with the ordering prescriber and your surgeon regarding if and when you should stop aspirin.    Home Medications               APPLE CIDER VINEGAR PO     Take by mouth every morning Takes 4 gummies at time     Notes:  Hold 1 wk before the surgery     Ascorbic Acid (vitamin C) 250 MG tablet     Take 500 mg by mouth every morning 2 250 mg in AM, total 500 mg     Notes:  Hold 1 wk before  the surgery     B Complex Vitamins (B COMPLEX VITAMIN PO)     Take by mouth every morning     Notes:  Hold 1 wk before the surgery     Vitamin D, Cholecalciferol, 50 MCG (2000 UT) Cap     Take 4,000 IU by mouth every morning     Notes:  Hold 1 wk before the surgery              SKIN PREPARATION INSTRUCTIONS  It is very important to clean your skin at home with a special germ-killing cleanser before your surgery day. Please clean your skin with an antimicrobial soap called 4% chlorhexidine gluconate, also known as CHG or Hibiclens.    CHG antimicrobial soap kills most germs on your skin. It reduces your risk of infection at your body's surgery site.     CHG may irritate recently shaved skin, so please do not shave or use any hair removal products prior to surgery.  If hair removal is necessary for your procedure, it will be done by the  surgical staff in the operating room.        How do I use CHG at home?   - Use the CHG solution in the shower   - Use CHG for the evening before your surgery AND on the morning of your surgery, for a total of 2 showers at home   - If you are having spinal surgery or a joint replacement, use CHG both evenings before your surgery AND on the morning of your surgery, for a total of 3 showers at home.    How do I obtain CHG?   You can buy CHG antimicrobial solution (liquid) at your pharmacy. You do not need a prescription. Ask for the CHG antimicrobial solution at the pharmacy counter.  You will need 2-4 ounces of the 4% CHG solution for each shower     INSTRUCTIONS: CLEANING WITH CHG SOLUTION IN THE SHOWER   1. For shower # 1 the evening before your surgery, wash yourself in the shower with your regular soap and shampoo first. Completely rinse the soap and shampoo off of your hair and body.   2. With shower water off, apply the CHG solution with your hands. Apply the soap to your entire body from the neck down. Include your groin area but not your genitals (private parts).   3. Clean the spot where your incision will be for about 3 minutes.  If you cannot reach this spot, have someone help you with the bathing.  Make sure they have thoroughly cleaned their hands before helping you.   4. Once you have finished putting the CHG on your skin and 3 minutes have passed, turn the water back on and rinse the CHG solution off your body.   5. Do not wash with regular soap after using the CHG solution   6. For the 2nd shower (taken the morning of your surgery), follow the instructions above but do not use regular soap. Only use the CHG solution.   7.  If you are having a spinal procedure or joint replacement surgery, you should take a total of 3 showers (2 nights prior to your procedure and the morning of your surgery).    After each shower:   - Completely dry the skin with a fresh, clean, dry towel   - Do not use lotions,  powders, or deodorants.   - Use clean sheets and pajamas each day  On surgery day:   After your shower dress in clean clothes to wear to the hospital.   Do not wear any jewelry to the hospital.  When you arrive at the hospital on surgery day, the nurse will ask you if you completed your CHG showers at home as instructed.     CHG Warnings:  * Do not apply CHG above your neck or on your private areas.  * If redness or skin irritation occurs from using CHG, stop using and contact your surgeon.  * Use CHG solution in the shower.  If it is not possible for you to shower, contact your surgeon's office for further instructions.    CHG - only - A packet containing two bottles of 4% chlorhexidine gluconate was given to the patient.  Instructions for use are included in the packet.  Allergies were reviewed by the NP before the patient received the packet.    QUESTIONS?  If you have any questions prior to your procedure, please call [phone] and leave a message.  This voice mail is monitored between 7am and 3pm Monday through Friday.    Our goal is to ensure you are safe for your upcoming procedure and to give you easy to understand instructions about getting ready for your procedure.

## 2020-11-10 NOTE — H&P (Incomplete Revision)
Gabriella Dean, 65 y.o., female, presents to the [facility] clinic for a pre-operative evaluation.    Diagnosis: Multinodular goiter [E04.2]    Patient is scheduled for: Procedure(s):  TOTAL THYROIDECTOMY    Currently scheduled for 11/12/2020  1230   with Krystal Clark, MD     HPI: Pre operative evaluation     Risk Stratification:    Surgical Risk Category: Intermediate Risk   ASA Classification: II  RCRI: 0  Functional status METS: 6 (toured several local cities on foot, in prior week, > 10k steps without CP sxs)   STOPBANG: 2 (age/ BMI)    Anesthesia History:  Pt denies personal or family hx of anesthesia related complications.     Airway Assessment:   Mallampati score: III  TM distance: >3  Neck ROM: full  Mouth opening:  Dental: lower left posteriorly; otherwise intact    Assessment/Plan:  65 y.o.F with H/O Type 2 DM, HLD not on current medications presents for pre-op.    Multinodular goiter:   Lab on 08/26/2020  TSH: 0.813    DMII:  HgbA1c 8.3 (from 7.5 on 08/26/19)   Will start Metformin 500mg  BID today  Dr. Omar Person (PCP notified)   Surgeon (Dr. Judeen Hammans) office notified.      HLD:  Patient is following up with her PCP  Recommended to follow up with PCP    Most Recent Cardiac/Imaging Studies:  EKG on 11/10/2020  NSR  Left axis deviation      Patient is medically optimized and is at acceptable risk to proceed with the planned surgery.  Due to not optimal A1C, patient should follow up with PCP after surgery for possible possible trial of medication for management.    Pre op Instructions to Patient:  [facility] standard pre-operative instructions reviewed by P[facility] APP as follows:    1. NPO of solids/full liquids for a minimum of 8 hours prior to scheduled surgery time.  Clear liquids allowed up to 2 hours prior to arrival time.  Examples of clear liquids include water, apple juice, Gatorade, black coffee or black tea with no milk or cream (sugar or sweetener may be added).  2.   Medication Instructions: As documented in  EPIC medication list   3.   Applicable protocols reviewed (ERAS/SPINE/CHG)  4.   An After Visit Summary was printed and given to the patient.     Patient voiced understanding of all instructions.  All questions and concerns addressed at this time.   This assessment will be conveyed to the surgery and anesthesia teams & the patient will be evaluated the morning of surgery.      Patient Active Problem List    Diagnosis Date Noted   . Mixed hyperlipidemia 09/12/2019   . Multinodular goiter 06/20/2019   . Type 2 diabetes mellitus with other specified complication, without long-term current use of insulin 01/03/2019       Objective Data:    Blood pressure 131/81, pulse 87, height 1.778 m (5\' 10" ), weight 120.2 kg (265 lb), SpO2 99 %.    LABS and EKG:   Results for orders placed or performed in visit on 08/26/19   Lipid panel   Result Value Ref Range    Cholesterol 258 (H) 100 - 199 mg/dL    Triglycerides 57 0 - 149 mg/dL    HDL 74 >54 mg/dL    VLDL Calculated 9 5 - 40 mg/dL    LDL Chol Calculated (NIH) 098 (H) 0 - 99 mg/dL  Cholesterol / HDL Ratio 3.5 0.0 - 4.4 ratio   Hemoglobin A1C   Result Value Ref Range    Hemoglobin A1C 7.5 (H) 4.8 - 5.6 %   Pap IG, Aptima HPV,rfx 16/18,45   Result Value Ref Range    DIAGNOSIS: Comment     Pap Specimen Adequacy: Comment     Clinician Provided ICD10: Comment     Performed by: Comment     . Marland Kitchen     Note: Comment     Test Methodology: Comment     HPV APTIMA Negative Negative         Past Medical History:   Diagnosis Date   . Type 2 diabetes mellitus, controlled        Past Surgical History:   Procedure Laterality Date   . D & C  1976    bleeding   . D & C  1988    miscarriage   . PREAURICULAR SKIN TAG EXCISION      1975   . TONSILLECTOMY AND ADENOIDECTOMY  1967       Family History   Problem Relation Age of Onset   . COPD Mother         Smoker   . Hypertension Mother    . Lung cancer Father    . Cancer Father         tumor in chest, unknown type   . Diabetes Father    . Diabetes  Sister    . Diabetes Brother    . Stroke Brother    . Prostate cancer Brother    . Diabetes Brother    . Heart disease Brother    . Stent Brother    . Cerebral aneurysm Paternal Grandmother    . Lung cancer Maternal Grandfather    . Cancer Maternal Grandfather         unknown type   . Coronary artery disease Maternal Grandmother        Social History     Tobacco Use   Smoking Status Never Smoker   Smokeless Tobacco Never Used       Social History     Substance and Sexual Activity   Alcohol Use Yes    Comment: socially     Allergies   Allergen Reactions   . Ampicillin Nausea And Vomiting and Rash   . Chlor-Trimeton [Chlorpheniramine] Nausea And Vomiting and Rash   . Erythromycin Nausea And Vomiting and Rash   . Penicillins Nausea And Vomiting and Rash   . Phenergan [Promethazine] Nausea And Vomiting   . Tetracycline Nausea And Vomiting and Rash     Current Medications: No current facility-administered medications for this encounter.    Current Outpatient Medications:   .  APPLE CIDER VINEGAR PO, Take by mouth every morning Takes 4 gummies at time, Disp: , Rfl:   .  Ascorbic Acid (vitamin C) 250 MG tablet, Take 500 mg by mouth every morning 2 250 mg in AM, total 500 mg, Disp: , Rfl:   .  B Complex Vitamins (B COMPLEX VITAMIN PO), Take by mouth every morning, Disp: , Rfl:   .  Vitamin D, Cholecalciferol, 50 MCG (2000 UT) Cap, Take 4,000 IU by mouth every morning, Disp: , Rfl:     History & Physical:    Review of Systems   Constitutional: Negative.    HENT: Negative.    Eyes: Negative.    Respiratory: Negative.    Cardiovascular: Negative.    Gastrointestinal: Negative.  Genitourinary: Negative.    Musculoskeletal: Negative.    Skin: Negative.    Neurological: Negative.    Endo/Heme/Allergies: Positive for environmental allergies.   Psychiatric/Behavioral: The patient is nervous/anxious.         Situational       Physical Exam  Constitutional:       Appearance: Normal appearance.   Eyes:      Extraocular Movements:  Extraocular movements intact.   Neck:      Comments: goiter  Musculoskeletal:      Cervical back: Normal range of motion.      Right lower leg: No edema.      Left lower leg: No edema.   Skin:     General: Skin is warm and dry.   Neurological:      General: No focal deficit present.      Mental Status: She is alert and oriented to person, place, and time. Mental status is at baseline.   Psychiatric:         Mood and Affect: Mood normal.         Behavior: Behavior normal.         Thought Content: Thought content normal.         Judgment: Judgment normal.       Girtha Hake, PA   Pre-Procedural Evaluation Clinic  [facility]  11/10/2020

## 2020-11-10 NOTE — H&P (Addendum)
Gabriella Dean, 65 y.o., female, presents to the [facility] clinic for a pre-operative evaluation.    Diagnosis: Multinodular goiter [E04.2]    Patient is scheduled for: Procedure(s):  TOTAL THYROIDECTOMY    Currently scheduled for 11/12/2020  1230   with Krystal Clark, MD     HPI: Pre operative evaluation     Risk Stratification:    Surgical Risk Category: Intermediate Risk   ASA Classification: II  RCRI: 0  Functional status METS: 6 (toured several local cities on foot, in prior week, > 10k steps without CP sxs)   STOPBANG: 2 (age/ BMI)    Anesthesia History:  Pt denies personal or family hx of anesthesia related complications.     Airway Assessment:   Mallampati score: III  TM distance: >3  Neck ROM: full  Mouth opening:  Dental: lower left posteriorly; otherwise intact    Assessment/Plan:  65 y.o.F with H/O Type 2 DM, HLD currently not on medication who presents for pre-op evaluation.    Multinodular goiter:   Lab on 08/26/2020  TSH: 0.813    DMII:  HgbA1c 8.3 (from 7.5 on 08/26/19)   Will start Metformin 500mg  BID today  Dr. Omar Person (PCP notified)   Surgeon (Dr. Judeen Hammans) office notified.    Instructed to HOLD Metformin DOS.     HLD:  Recommend initiation of Statin therapy     Most Recent Cardiac/Imaging Studies:  EKG on 11/10/2020  NSR  Left axis deviation    Patient is not medically optimized 2/2 poorly controlled diabetes (HgbA1c 8.3). She has been started on Metformin 500mg  BID and her PCP (Dr. Omar Person) as well as Dr. Judeen Hammans have been notified.   She is at elevated risk for perioperative complications 2/2 uncontrolled diabetes, will defer to surgeon regarding time sensitive nature of procedure.     Pre op Instructions to Patient:  [facility] standard pre-operative instructions reviewed by [facility] APP as follows:    1. NPO of solids/full liquids for a minimum of 8 hours prior to scheduled surgery time.  Clear liquids allowed up to 2 hours prior to arrival time.  Examples of clear liquids include water, apple juice,  Gatorade, black coffee or black tea with no milk or cream (sugar or sweetener may be added).  2.   Medication Instructions: As documented in EPIC medication list   3.   Applicable protocols reviewed (ERAS/SPINE/CHG)  4.   An After Visit Summary was printed and given to the patient.     Patient voiced understanding of all instructions.  All questions and concerns addressed at this time.   This assessment will be conveyed to the surgery and anesthesia teams & the patient will be evaluated the morning of surgery.    Patient Active Problem List    Diagnosis Date Noted   . Multinodular goiter 06/20/2019     Priority: High   . Mixed hyperlipidemia 09/12/2019   . Type 2 diabetes mellitus with other specified complication, without long-term current use of insulin 01/03/2019       Objective Data:    Blood pressure 131/81, pulse 87, height 1.778 m (5\' 10" ), weight 120.2 kg (265 lb), SpO2 99 %.    Results for orders placed or performed in visit on 11/10/20   COVID-19 (SARS-CoV-2)    Specimen: Nasopharynx; Nasopharyngeal Swab   Result Value Ref Range    Purpose of COVID testing Screening     SARS-CoV-2 Specimen Source Nasopharyngeal     SARS CoV 2 Dean Result  Not Detected    Comprehensive metabolic panel   Result Value Ref Range    Glucose 151 (H) 70 - 100 mg/dL    BUN 16.1 7.0 - 09.6 mg/dL    Creatinine 0.7 0.6 - 1.0 mg/dL    Sodium 045 409 - 811 mEq/L    Potassium 4.1 3.5 - 5.1 mEq/L    Chloride 101 100 - 111 mEq/L    CO2 28 22 - 29 mEq/L    Calcium 9.8 8.5 - 10.5 mg/dL    Protein, Total 7.4 6.0 - 8.3 g/dL    Albumin 3.6 3.5 - 5.0 g/dL    AST (SGOT) 17 5 - 34 U/L    ALT 17 0 - 55 U/L    Alkaline Phosphatase 78 37 - 117 U/L    Bilirubin, Total 0.6 0.2 - 1.2 mg/dL    Globulin 3.8 (H) 2.0 - 3.6 g/dL    Albumin/Globulin Ratio 0.9 0.9 - 2.2    Anion Gap 8.0 5.0 - 15.0   CBC and differential   Result Value Ref Range    WBC 4.82 3.10 - 9.50 x10 3/uL    Hgb 14.7 11.4 - 14.8 g/dL    Hematocrit 91.4 (H) 34.7 - 43.7 %    Platelets  288 142 - 346 x10 3/uL    RBC 5.06 3.90 - 5.10 x10 6/uL    MCV 89.5 78.0 - 96.0 fL    MCH 29.1 25.1 - 33.5 pg    MCHC 32.5 31.5 - 35.8 g/dL    RDW 12 11 - 15 %    MPV 10.4 8.9 - 12.5 fL    Neutrophils 38.5 None %    Lymphocytes Automated 50.8 None %    Monocytes 9.1 None %    Eosinophils Automated 0.8 None %    Basophils Automated 0.6 None %    Immature Granulocytes 0.2 None %    Nucleated RBC 0.0 0.0 - 0.0 /100 WBC    Neutrophils Absolute 1.85 1.10 - 6.33 x10 3/uL    Lymphocytes Absolute Automated 2.45 0.42 - 3.22 x10 3/uL    Monocytes Absolute Automated 0.44 0.21 - 0.85 x10 3/uL    Eosinophils Absolute Automated 0.04 0.00 - 0.44 x10 3/uL    Basophils Absolute Automated 0.03 0.00 - 0.08 x10 3/uL    Immature Granulocytes Absolute 0.01 0.00 - 0.07 x10 3/uL    Absolute NRBC 0.00 0.00 - 0.00 x10 3/uL   Hemoglobin A1C   Result Value Ref Range    Hemoglobin A1C 8.3 (H) 4.6 - 5.9 %    Average Estimated Glucose 191.5 mg/dL   GFR   Result Value Ref Range    EGFR >60.0    ECG 12 lead   Result Value Ref Range    Ventricular Rate 75 BPM    Atrial Rate 75 BPM    P-R Interval 148 ms    QRS Duration 84 ms    Q-T Interval 398 ms    QTC Calculation (Bezet) 444 ms    P Axis 61 degrees    R Axis -43 degrees    T Axis 5 degrees    IHS MUSE NARRATIVE AND IMPRESSION       NORMAL SINUS RHYTHM  LEFT AXIS DEVIATION  ABNORMAL ECG  NO PREVIOUS ECGS AVAILABLE  Confirmed by VIVES MD, MARK C. ([id number]) on 11/10/2020 1:58:41 PM     Type and Screen   Result Value Ref Range    ABO Rh O POS  AB Screen Gel NEG          Past Medical History:   Diagnosis Date   . Type 2 diabetes mellitus, controlled        Past Surgical History:   Procedure Laterality Date   . D & C  1976    bleeding   . D & C  1988    miscarriage   . PREAURICULAR SKIN TAG EXCISION      1975   . TONSILLECTOMY AND ADENOIDECTOMY  1967       Family History   Problem Relation Age of Onset   . COPD Mother         Smoker   . Hypertension Mother    . Lung cancer Father    . Cancer Father          tumor in chest, unknown type   . Diabetes Father    . Diabetes Sister    . Diabetes Brother    . Stroke Brother    . Prostate cancer Brother    . Diabetes Brother    . Heart disease Brother    . Stent Brother    . Cerebral aneurysm Paternal Grandmother    . Lung cancer Maternal Grandfather    . Cancer Maternal Grandfather         unknown type   . Coronary artery disease Maternal Grandmother        Social History     Tobacco Use   Smoking Status Never Smoker   Smokeless Tobacco Never Used       Social History     Substance and Sexual Activity   Alcohol Use Yes    Comment: socially     Allergies   Allergen Reactions   . Ampicillin Nausea And Vomiting and Rash   . Chlor-Trimeton [Chlorpheniramine] Nausea And Vomiting and Rash   . Erythromycin Nausea And Vomiting and Rash   . Penicillins Nausea And Vomiting and Rash   . Phenergan [Promethazine] Nausea And Vomiting   . Tetracycline Nausea And Vomiting and Rash     Current Medications: No current facility-administered medications for this encounter.    Current Outpatient Medications:   .  APPLE CIDER VINEGAR PO, Take by mouth every morning Takes 4 gummies at time, Disp: , Rfl:   .  Ascorbic Acid (vitamin C) 250 MG tablet, Take 500 mg by mouth every morning 2 250 mg in AM, total 500 mg, Disp: , Rfl:   .  B Complex Vitamins (B COMPLEX VITAMIN PO), Take by mouth every morning, Disp: , Rfl:   .  Vitamin D, Cholecalciferol, 50 MCG (2000 UT) Cap, Take 4,000 IU by mouth every morning, Disp: , Rfl:     History & Physical:    Review of Systems   Constitutional: Negative.    HENT: Negative.    Eyes: Negative.    Respiratory: Negative.    Cardiovascular: Negative.    Gastrointestinal: Negative.    Genitourinary: Negative.    Musculoskeletal: Negative.    Skin: Negative.    Neurological: Negative.    Endo/Heme/Allergies: Positive for environmental allergies.   Psychiatric/Behavioral: The patient is nervous/anxious.         Situational       Physical Exam  Constitutional:        Appearance: Normal appearance.   HENT:      Head: Normocephalic.      Mouth/Throat:      Mouth: Mucous membranes are moist.  Pharynx: Oropharynx is clear.   Eyes:      Extraocular Movements: Extraocular movements intact.   Neck:      Comments: goiter  Cardiovascular:      Rate and Rhythm: Normal rate and regular rhythm.   Pulmonary:      Effort: Pulmonary effort is normal.      Breath sounds: Normal breath sounds.   Abdominal:      Palpations: Abdomen is soft.      Tenderness: There is no abdominal tenderness.   Musculoskeletal:      Cervical back: Normal range of motion.      Right lower leg: No edema.      Left lower leg: No edema.   Skin:     General: Skin is warm and dry.   Neurological:      General: No focal deficit present.      Mental Status: She is alert and oriented to person, place, and time. Mental status is at baseline.   Psychiatric:         Mood and Affect: Mood normal.         Behavior: Behavior normal.         Thought Content: Thought content normal.         Judgment: Judgment normal.       Girtha Hake, PA   Pre-Procedural Evaluation Clinic  [facility]  11/10/2020

## 2020-11-11 ENCOUNTER — Other Ambulatory Visit: Payer: Self-pay

## 2020-11-11 ENCOUNTER — Encounter (INDEPENDENT_AMBULATORY_CARE_PROVIDER_SITE_OTHER): Payer: Self-pay | Admitting: Family Medicine

## 2020-11-11 ENCOUNTER — Ambulatory Visit (INDEPENDENT_AMBULATORY_CARE_PROVIDER_SITE_OTHER): Payer: BLUE CROSS/BLUE SHIELD | Admitting: Family Medicine

## 2020-11-11 ENCOUNTER — Other Ambulatory Visit: Payer: Self-pay | Admitting: Physician Assistant

## 2020-11-11 VITALS — BP 132/78 | HR 90 | Temp 98.9°F | Resp 16 | Ht 70.0 in | Wt 290.0 lb

## 2020-11-11 DIAGNOSIS — E042 Nontoxic multinodular goiter: Secondary | ICD-10-CM

## 2020-11-11 DIAGNOSIS — E1165 Type 2 diabetes mellitus with hyperglycemia: Secondary | ICD-10-CM

## 2020-11-11 MED ORDER — METFORMIN HCL 500 MG PO TABS
500.0000 mg | ORAL_TABLET | Freq: Two times a day (BID) | ORAL | 0 refills | Status: DC
Start: 2020-11-11 — End: 2020-12-11

## 2020-11-11 NOTE — Progress Notes (Signed)
[facility] [place] [facility] [employer] [place] [employer]                       Date of Exam: 11/11/2020 4:08 PM        Patient ID: Gabriella Dean is a 65 y.o. female.  Attending Physician: Doyle Askew, MD        Chief Complaint:    No chief complaint on file.              HPI:    Was seen for surgical prescreening on 5/31 prior to thyroidectomy, found to have HbA1c of 8.3%. Previously 7.5 in March of 2021, and at the time elected to address with aggressive lifestyle management. Did not follow up in clinic for 14-month follow up, and then found to have A1c as above pre-procedure. Was prescribed Metformin 500mg  BID after procedure cancelled, but has not taken it.     Last March had visit and was unable to adhere to lifestyle changes due to stress. HATES taking medicines.     Diet has been very good aside from small snacks at night. Does not usually eat breakfast, but if she does it is yogurt with fruit/nuts. Lunch is salad. For dinner will do roasted salmon and brussel sprouts. Rarely eats bread, rice, potatoes, will substitute with salad/cauliflower. Only drinks water with lemon/ginger, coffee with small amount of cream/cinnamon. Snack at night will be salty/crunchy: multigrain chips, small chocolates (1-2 at time), slice of cheese. Will snack 4 days a week.     Lifestyle/habits: works at [employer] but has a very stressful job. Works from home 2 days a week, 3 days a week at [employer]. Not walking/exercising very much, approx 3000-4000 steps a day, no other exercise. Active on weekend (walking and doing things) but not strenuously exercising.      No other symptoms, no nausea/vomiting/diarrhea. No visual changes (last eye exam with Ophthalmology in January). No peripheral neuropathy. No chest pain/shortness of breath.                Problem List:    Patient Active Problem List   Diagnosis   . Type 2 diabetes mellitus with other specified complication, without long-term current  use of insulin   . Multinodular goiter   . Mixed hyperlipidemia             Current Meds:    No outpatient medications have been marked as taking for the 11/11/20 encounter (Office Visit) with Doyle Askew, MD.          Allergies:    Allergies   Allergen Reactions   . Ampicillin Nausea And Vomiting and Rash   . Chlor-Trimeton [Chlorpheniramine] Nausea And Vomiting and Rash   . Erythromycin Nausea And Vomiting and Rash   . Penicillins Nausea And Vomiting and Rash   . Phenergan [Promethazine] Nausea And Vomiting   . Tetracycline Nausea And Vomiting and Rash             Past Surgical History:    Past Surgical History:   Procedure Laterality Date   . D & C  1976    bleeding   . D & C  1988    miscarriage   . PREAURICULAR SKIN TAG EXCISION      1975   . TONSILLECTOMY AND ADENOIDECTOMY  1967           Family History:    Family History  Problem Relation Age of Onset   . COPD Mother         Smoker   . Hypertension Mother    . Lung cancer Father    . Cancer Father         tumor in chest, unknown type   . Diabetes Father    . Diabetes Sister    . Diabetes Brother    . Stroke Brother    . Prostate cancer Brother    . Diabetes Brother    . Heart disease Brother    . Stent Brother    . Cerebral aneurysm Paternal Grandmother    . Lung cancer Maternal Grandfather    . Cancer Maternal Grandfather         unknown type   . Coronary artery disease Maternal Grandmother            Social History:    Social History     Tobacco Use   . Smoking status: Never Smoker   . Smokeless tobacco: Never Used   Vaping Use   . Vaping Use: Never used   Substance Use Topics   . Alcohol use: Yes     Comment: socially   . Drug use: Never           The following sections were reviewed this encounter by the provider:              Vital Signs:    BP 132/78   Pulse 90   Temp 98.9 F (37.2 C)   Resp 16   Ht 1.778 m (5\' 10" )   Wt 131.5 kg (290 lb)   BMI 41.61 kg/m          ROS:    Review of Systems All systems reviewed and negative except as per  HPI          Physical Exam:    Physical Exam   Gen: AAOx3, NAD  HEENT: Notable goiter, diffusely enlarged, hard texture  Cardiac: RRR. No murmurs, gallops, or rubs  Lungs: CTAB. No wheezes, rales, or rhonchi  Abdomen: Soft, non-distended, non-tender  Ext: No LE edema          Assessment:    Diabetes mellitus type II, A1c of 8.3%  Goiter          Plan:    Discussed beginning Metformin 500 BID, discussed side effects. Dr. Omar Person spoke with ENT and decision made to defer surgery for now. Follow up in 1 month for repeat A1c check, annual exam, follow-up on diabetes, and try to aim for surgery early July.          Follow-up:    Annual visit in 1 month (end of June)       Doyle Askew, MD

## 2020-11-11 NOTE — Progress Notes (Signed)
Subjective:      Patient ID: Gabriella Dean is a 65 y.o. female.    Chief Complaint:  Chief Complaint   Patient presents with   . Diabetes       HPI:  The patient presents for follow up.  Issues include:    1.  Goiter.  Her goiter has been slowly growing and she's seen ENT for removal.  She's scheduled for surgery tomorrow, but her A1C was noted to be elevated on preop labs.  Her surgeon would like to discuss whether to proceed with surgery or not.    2.  Type 2 Diabetes.  When she was last seen over a year ago, her A1C was 7.5%. At that time, she desired to try more stringent lifestyle changes in order to avoid medication.  She went to have preop clearance done, and her labs returned yesterday with an A1C of 8.3%.  She was advised to start metformin but hasn't taken her first dose due to the uncertainty regarding her surgery.  She notes that she could be more meticulous about her diet and that she could exercise more.      Problem List:  Patient Active Problem List   Diagnosis   . Type 2 diabetes mellitus with other specified complication, without long-term current use of insulin   . Multinodular goiter   . Mixed hyperlipidemia       Current Medications:  Current Outpatient Medications   Medication Sig Dispense Refill   . APPLE CIDER VINEGAR PO Take by mouth every morning Takes 4 gummies at time     . Ascorbic Acid (vitamin C) 250 MG tablet Take 500 mg by mouth every morning 2 250 mg in AM, total 500 mg     . B Complex Vitamins (B COMPLEX VITAMIN PO) Take by mouth every morning     . metFORMIN (GLUCOPHAGE) 500 MG tablet Take 1 tablet (500 mg total) by mouth 2 (two) times daily with meals 60 tablet 0   . Vitamin D, Cholecalciferol, 50 MCG (2000 UT) Cap Take 4,000 IU by mouth every morning       No current facility-administered medications for this visit.       Allergies:  Allergies   Allergen Reactions   . Ampicillin Nausea And Vomiting and Rash   . Chlor-Trimeton [Chlorpheniramine] Nausea And Vomiting and Rash    . Erythromycin Nausea And Vomiting and Rash   . Penicillins Nausea And Vomiting and Rash   . Phenergan [Promethazine] Nausea And Vomiting   . Tetracycline Nausea And Vomiting and Rash       Past Medical History:  Past Medical History:   Diagnosis Date   . Type 2 diabetes mellitus, controlled        Past Surgical History:  Past Surgical History:   Procedure Laterality Date   . D & C  1976    bleeding   . D & C  1988    miscarriage   . PREAURICULAR SKIN TAG EXCISION      1975   . TONSILLECTOMY AND ADENOIDECTOMY  1967       Family History:  Family History   Problem Relation Age of Onset   . COPD Mother         Smoker   . Hypertension Mother    . Lung cancer Father    . Cancer Father         tumor in chest, unknown type   . Diabetes Father    .  Diabetes Sister    . Diabetes Brother    . Stroke Brother    . Prostate cancer Brother    . Diabetes Brother    . Heart disease Brother    . Stent Brother    . Cerebral aneurysm Paternal Grandmother    . Lung cancer Maternal Grandfather    . Cancer Maternal Grandfather         unknown type   . Coronary artery disease Maternal Grandmother        Social History:  Social History     Socioeconomic History   . Marital status: Single   Tobacco Use   . Smoking status: Never Smoker   . Smokeless tobacco: Never Used   Vaping Use   . Vaping Use: Never used   Substance and Sexual Activity   . Alcohol use: Yes     Comment: socially   . Drug use: Never        The following sections were reviewed this encounter by the provider:   Tobacco  Allergies  Meds  Problems  Med Hx  Surg Hx  Fam Hx           ROS:  Review of Systems   Constitutional: Negative for activity change, appetite change and unexpected weight change.   HENT: Negative for drooling, trouble swallowing and voice change.    Respiratory: Negative for choking and shortness of breath.        Vitals:  BP 132/78   Pulse 90   Temp 98.9 F (37.2 C)   Resp 16   Ht 1.778 m (5\' 10" )   Wt 131.5 kg (290 lb)   BMI 41.61 kg/m       Objective:     Physical Exam:  Physical Exam  Constitutional:       General: She is not in acute distress.     Appearance: Normal appearance.   Neck:      Thyroid: Thyromegaly present.   Cardiovascular:      Rate and Rhythm: Normal rate and regular rhythm.      Heart sounds: No murmur heard.  Pulmonary:      Effort: Pulmonary effort is normal.      Breath sounds: No wheezing, rhonchi or rales.   Neurological:      Mental Status: She is alert.          Assessment:     1. Type 2 diabetes mellitus with hyperglycemia, without long-term current use of insulin    2. Multinodular goiter      Plan:     1. Type 2 Diabetes.  Moderately exacerbated.  Metformin 500 mg BID before breakfast and dinner.  Side effects discussed.  2.  Multinodular goiter.  Increasing in size. Placed a phone call to Dr. Judeen Hammans, the patient's thyroid surgeon.  She feels that the patient is not in imminent danger of having her airway compromised by her enlarging goiter.  If the patient wished to proceed with surgery, she would likely need to do it in a staged fashion in order to give the best chance to avoid infection and delayed healing given her glucoses currently.  If her glucoses were under better control, she could consider doing a total thyroidectomy assuming that anatomy allowed.  I discussed with the patient that while surgery is not absolutely contraindicated with her current A1C there is a higher risk of infection or delayed healing.  I also discussed that proceeding now would likely mean a definite staged procedure.  After discussion, the patient agreed that postponing her surgery for 2 months was likely the best course of action and she agreed to take metformin. She will follow up in one month for a wellness exam and labs.    Doyle Askew, MD

## 2020-11-11 NOTE — Patient Instructions (Signed)
Please start taking metformin one pill (500 mg) twice daily to lower your blood sugar.  I'd also recommend getting at least 10,000 steps per day.    Please follow up in a month so that we can check your blood sugar and make any adjustments needed to try to have your surgery done in 2 months.

## 2020-11-12 ENCOUNTER — Encounter: Admission: RE | Payer: Self-pay | Source: Ambulatory Visit

## 2020-11-12 ENCOUNTER — Ambulatory Visit: Admission: RE | Admit: 2020-11-12 | Payer: BLUE CROSS/BLUE SHIELD | Source: Ambulatory Visit | Admitting: Otolaryngology

## 2020-11-12 HISTORY — DX: Type 2 diabetes mellitus without complications: E11.9

## 2020-11-12 SURGERY — THYROIDECTOMY, TOTAL
Anesthesia: General | Site: Neck | Laterality: Bilateral

## 2020-12-04 ENCOUNTER — Ambulatory Visit (INDEPENDENT_AMBULATORY_CARE_PROVIDER_SITE_OTHER): Payer: BLUE CROSS/BLUE SHIELD | Admitting: Family Medicine

## 2020-12-11 ENCOUNTER — Ambulatory Visit (INDEPENDENT_AMBULATORY_CARE_PROVIDER_SITE_OTHER): Payer: BLUE CROSS/BLUE SHIELD | Admitting: Family Medicine

## 2020-12-11 ENCOUNTER — Encounter (INDEPENDENT_AMBULATORY_CARE_PROVIDER_SITE_OTHER): Payer: Self-pay | Admitting: Family Medicine

## 2020-12-11 VITALS — BP 126/60 | HR 84 | Temp 98.3°F | Resp 16 | Wt 296.0 lb

## 2020-12-11 DIAGNOSIS — Z13820 Encounter for screening for osteoporosis: Secondary | ICD-10-CM

## 2020-12-11 DIAGNOSIS — E1165 Type 2 diabetes mellitus with hyperglycemia: Secondary | ICD-10-CM

## 2020-12-11 DIAGNOSIS — Z0000 Encounter for general adult medical examination without abnormal findings: Secondary | ICD-10-CM

## 2020-12-11 DIAGNOSIS — E782 Mixed hyperlipidemia: Secondary | ICD-10-CM

## 2020-12-11 DIAGNOSIS — Z1211 Encounter for screening for malignant neoplasm of colon: Secondary | ICD-10-CM

## 2020-12-11 DIAGNOSIS — R2242 Localized swelling, mass and lump, left lower limb: Secondary | ICD-10-CM

## 2020-12-11 DIAGNOSIS — Z1231 Encounter for screening mammogram for malignant neoplasm of breast: Secondary | ICD-10-CM

## 2020-12-11 LAB — URINE MICROALBUMIN W CREATININE POCT
Urine Creatinine POCT: 100 mg/dl (ref 100–300)
Urine Microalbumin POCT: 30 mg/dl (ref 10–150)

## 2020-12-11 LAB — POCT HEMOGLOBIN A1C: POCT Hgb A1C: 7.1 % — AB (ref 3.9–5.9)

## 2020-12-11 MED ORDER — METFORMIN HCL 500 MG PO TABS
500.0000 mg | ORAL_TABLET | Freq: Two times a day (BID) | ORAL | 5 refills | Status: DC
Start: 2020-12-11 — End: 2021-01-10

## 2020-12-11 NOTE — Progress Notes (Signed)
[facility] [place] - [facility] [place] [employer]                       Date of Exam: 12/11/2020 3:20 PM        Patient ID: Gabriella Dean is a 65 y.o. female.  Attending Physician: Doyle Askew, MD        Chief Complaint:    Chief Complaint   Patient presents with    Annual Exam     Blood work.               HPI:    The patient presents for a routine wellness exam.  She would like to have her A1C repeated after being on her metformin for a period of time.  She's due for a mammogram and a dexascan.  She had a previous referral for a colonoscopy but hasn't had it done yet.    She notes a lump on her left lower leg that's been present for at least a couple of months.  She also notes pain behind her left knee that makes it difficult for her to get up from a sitting position.            Problem List:    Patient Active Problem List   Diagnosis    Type 2 diabetes mellitus with other specified complication, without long-term current use of insulin    Multinodular goiter    Mixed hyperlipidemia             Current Meds:    Outpatient Medications Marked as Taking for the 12/11/20 encounter (Office Visit) with Doyle Askew, MD   Medication Sig Dispense Refill    APPLE CIDER VINEGAR PO Take by mouth every morning Takes 4 gummies at time      Ascorbic Acid (vitamin C) 250 MG tablet Take 500 mg by mouth every morning 2 250 mg in AM, total 500 mg      B Complex Vitamins (B COMPLEX VITAMIN PO) Take by mouth every morning      Vitamin D, Cholecalciferol, 50 MCG (2000 UT) Cap Take 4,000 IU by mouth every morning      [DISCONTINUED] metFORMIN (GLUCOPHAGE) 500 MG tablet Take 1 tablet (500 mg total) by mouth 2 (two) times daily with meals 60 tablet 0          Allergies:    Allergies   Allergen Reactions    Ampicillin Nausea And Vomiting and Rash    Chlor-Trimeton [Chlorpheniramine] Nausea And Vomiting and Rash    Erythromycin Nausea And Vomiting and Rash    Penicillins Nausea And Vomiting and Rash     Phenergan [Promethazine] Nausea And Vomiting    Tetracycline Nausea And Vomiting and Rash             Past Surgical History:    Past Surgical History:   Procedure Laterality Date    D & C  1976    bleeding    D & C  1988    miscarriage    PREAURICULAR SKIN TAG EXCISION      1975    TONSILLECTOMY AND ADENOIDECTOMY  1967           Family History:    Family History   Problem Relation Age of Onset    COPD Mother         Smoker    Hypertension Mother     Lung cancer Father  Cancer Father         tumor in chest, unknown type    Diabetes Father     Diabetes Sister     Diabetes Brother     Stroke Brother     Prostate cancer Brother     Diabetes Brother     Heart disease Brother     Stent Brother     Cerebral aneurysm Paternal Grandmother     Lung cancer Maternal Grandfather     Cancer Maternal Grandfather         unknown type    Coronary artery disease Maternal Grandmother            Social History:    Social History     Tobacco Use    Smoking status: Never    Smokeless tobacco: Never   Vaping Use    Vaping Use: Never used   Substance Use Topics    Alcohol use: Yes     Comment: socially    Drug use: Never           The following sections were reviewed this encounter by the provider:   Tobacco  Allergies  Meds  Problems  Med Hx  Surg Hx  Fam Hx               Vital Signs:    BP 126/60   Pulse 84   Temp 98.3 F (36.8 C)   Resp 16   Wt 134.3 kg (296 lb)   BMI 42.47 kg/m          ROS:    Review of Systems   Constitutional:  Negative for activity change, appetite change and unexpected weight change.   Musculoskeletal:  Positive for arthralgias.   Psychiatric/Behavioral:  Negative for decreased concentration.             Physical Exam:    Physical Exam  Constitutional:       Appearance: Normal appearance.   HENT:      Head: Normocephalic and atraumatic.      Right Ear: Tympanic membrane and ear canal normal.      Left Ear: Tympanic membrane and ear canal normal.   Eyes:      Extraocular Movements: Extraocular  movements intact.      Conjunctiva/sclera: Conjunctivae normal.      Pupils: Pupils are equal, round, and reactive to light.   Neck:      Thyroid: No thyromegaly.      Vascular: No carotid bruit.   Cardiovascular:      Rate and Rhythm: Normal rate and regular rhythm.      Chest Wall: PMI is not displaced.      Pulses: Normal pulses.      Heart sounds: Normal heart sounds. No murmur heard.  Pulmonary:      Effort: Pulmonary effort is normal.      Breath sounds: Normal breath sounds.   Chest:   Breasts:     Right: Normal.      Left: Normal.   Abdominal:      General: Abdomen is flat.      Palpations: Abdomen is soft.      Tenderness: There is no abdominal tenderness.   Musculoskeletal:         General: Normal range of motion.      Cervical back: Normal range of motion.      Right lower leg: No edema.      Left lower leg: Edema present.  Legs:       Comments: 3 X 4 cm mass lateral aspect of LLE.  Tenderness of left popliteal fossa.   Lymphadenopathy:      Cervical: No cervical adenopathy.      Upper Body:      Right upper body: No axillary adenopathy.      Left upper body: No axillary adenopathy.   Skin:     General: Skin is warm and dry.      Findings: No lesion.   Neurological:      General: No focal deficit present.      Mental Status: She is alert and oriented to person, place, and time.      Deep Tendon Reflexes: Reflexes normal.   Psychiatric:         Mood and Affect: Mood normal.            Assessment:    1. Well woman exam (no gynecological exam)  - metFORMIN (GLUCOPHAGE) 500 MG tablet; Take 1 tablet (500 mg total) by mouth 2 (two) times daily with meals  Dispense: 60 tablet; Refill: 5    2. Type 2 diabetes mellitus with hyperglycemia, without long-term current use of insulin  - POCT Hemoglobin A1C  - POCT Microalbumin, urine    3. Mixed hyperlipidemia  - Lipid panel    4. Screen for colon cancer  - Ambulatory referral to Gastroenterology    5. Screening for osteoporosis  - Dxa Bone Density Axial  Skeleton    6. Encounter for screening mammogram for malignant neoplasm of breast  - Mammo Digital Screening Bilateral W Cad    7. Mass of left lower extremity  - US Extremity Nonvasc Left Complete            Plan:      1. Recommend 5 servings of fruits and vegetables daily as part of a diet that also includes dairy, whole grains and lean meats with limited red meat.    2. Recommend 150 minutes of aerobic exercise per week or 10,000 steps daily.     3. Labs as recorded, patient will be contacted with results.    4. Mammogram and dexascan ordered.    5. Ultrasound of LLE ordered.    6. Referred for colonoscopy.    7. Medication refilled as recorded.          Follow-up:    Return in about 3 months (around 03/13/2021) for medication check.         Doyle Askew, MD

## 2020-12-11 NOTE — Patient Instructions (Signed)
Labs were ordered to check your cholesterol.    Your A1C was much improved at 7.1.    You received an order for a bone density test and a mammogram.    You received an order for an ultrasound of your left leg to further evaluate your lump and your pain.    You were referred for a colonoscopy.    Your metformin was refilled.    Please follow up in 3 months or sooner as needed.

## 2020-12-12 LAB — LIPID PANEL
Cholesterol / HDL Ratio: 3.5 ratio (ref 0.0–4.4)
Cholesterol: 273 mg/dL — ABNORMAL HIGH (ref 100–199)
HDL: 77 mg/dL (ref >39)
LDL Chol Calculated (NIH): 181 mg/dL — ABNORMAL HIGH (ref 0–99)
Triglycerides: 88 mg/dL (ref 0–149)
VLDL Calculated: 15 mg/dL (ref 5–40)

## 2020-12-22 NOTE — Progress Notes (Signed)
1st contact - LVM 7/12/~jam Return in 3 mo (around 03/13/2021) for med ck, per Dr. Omar Person

## 2021-01-05 ENCOUNTER — Encounter (INDEPENDENT_AMBULATORY_CARE_PROVIDER_SITE_OTHER): Payer: Self-pay

## 2021-01-27 ENCOUNTER — Ambulatory Visit (INDEPENDENT_AMBULATORY_CARE_PROVIDER_SITE_OTHER): Payer: BLUE CROSS/BLUE SHIELD | Admitting: Internal Medicine

## 2021-01-27 ENCOUNTER — Encounter: Payer: Self-pay | Admitting: Internal Medicine

## 2021-01-27 VITALS — BP 126/83 | HR 74 | Ht 70.0 in | Wt 285.0 lb

## 2021-01-27 DIAGNOSIS — E119 Type 2 diabetes mellitus without complications: Secondary | ICD-10-CM

## 2021-01-27 DIAGNOSIS — Z01818 Encounter for other preprocedural examination: Secondary | ICD-10-CM | POA: Insufficient documentation

## 2021-01-27 DIAGNOSIS — L309 Dermatitis, unspecified: Secondary | ICD-10-CM | POA: Insufficient documentation

## 2021-01-27 DIAGNOSIS — J302 Other seasonal allergic rhinitis: Secondary | ICD-10-CM | POA: Insufficient documentation

## 2021-01-27 DIAGNOSIS — Z6841 Body Mass Index (BMI) 40.0 and over, adult: Secondary | ICD-10-CM | POA: Insufficient documentation

## 2021-01-27 LAB — CBC AND DIFFERENTIAL
Absolute NRBC: 0 10*3/uL (ref 0.00–0.00)
Basophils Absolute Automated: 0.03 10*3/uL (ref 0.00–0.08)
Basophils Automated: 0.5 %
Eosinophils Absolute Automated: 0.1 10*3/uL (ref 0.00–0.44)
Eosinophils Automated: 1.5 %
Hematocrit: 40.7 % (ref 34.7–43.7)
Hgb: 13.9 g/dL (ref 11.4–14.8)
Immature Granulocytes Absolute: 0.02 10*3/uL (ref 0.00–0.07)
Immature Granulocytes: 0.3 %
Lymphocytes Absolute Automated: 2.7 10*3/uL (ref 0.42–3.22)
Lymphocytes Automated: 41.5 %
MCH: 30.3 pg (ref 25.1–33.5)
MCHC: 34.2 g/dL (ref 31.5–35.8)
MCV: 88.9 fL (ref 78.0–96.0)
MPV: 10.5 fL (ref 8.9–12.5)
Monocytes Absolute Automated: 0.56 10*3/uL (ref 0.21–0.85)
Monocytes: 8.6 %
Neutrophils Absolute: 3.1 10*3/uL (ref 1.10–6.33)
Neutrophils: 47.6 %
Nucleated RBC: 0 /100 WBC (ref 0.0–0.0)
Platelets: 278 10*3/uL (ref 142–346)
RBC: 4.58 10*6/uL (ref 3.90–5.10)
RDW: 13 % (ref 11–15)
WBC: 6.51 10*3/uL (ref 3.10–9.50)

## 2021-01-27 LAB — GLUCOSE, RANDOM: Glucose: 91 mg/dL (ref 70–100)

## 2021-01-27 NOTE — PEC In-Person Visit (H&P) (Addendum)
Pre-Anesthesia Evaluation     Pre-op Interview visit requested by:   Reason for pre-op interview visit: Patient anticipating TOTAL THYROIDECTOMY procedure.    History of Present Illness/Summary:  Patient presents to the [facility] clinic for a pre-operative evaluation.    Assessment/Plan:    1.  Encounter for Preoperative Examination (Z01.818)    2.  Surgical Diagnosis:  Multinodular goiter [E04.2]    3.  Diabetes Mellitus type II, diet controlled  -Patient recent HgbA1c 7.1 % (12/11/20), previously 8.3% (May 2022) and random glucose today 91. Patient instructed to continue metformin as prescribed and to hold day of surgery.    Labs reviewed from 12/11/20 and labs drawn today reviewed, WNL.    Patient is at elevated risk for perioperative CV complications 2/2 elevated HgbA1C, however not prohibitive to proceeding.    Patient voiced understanding of all instructions.  All questions and concerns addressed at this time. This assessment will be conveyed to the surgery and anesthesiology teams & the patient will be evaluated the morning of surgery.    Problem List:  Medical Problems       Hospital Problem List  Date Reviewed: 01/27/2021   None        Non-Hospital Problem List  Date Reviewed: 01/27/2021            ICD-10-CM Priority Class Noted    Multinodular goiter E04.2 High  06/20/2019    Type 2 diabetes mellitus without complication, without long-term current use of insulin E11.9   01/03/2019    Mixed hyperlipidemia E78.2   09/12/2019    Eczema L30.9   01/27/2021    Seasonal allergies J30.2   01/27/2021    Morbid obesity E66.01   01/27/2021    Body mass index 40.0-44.9, adult Z68.41   01/27/2021    Preop examination Z01.818   01/27/2021        Medical History   Diagnosis Date    Type 2 diabetes mellitus, controlled      Past Surgical History:   Procedure Laterality Date    CYST REMOVAL Left     Preauricular Left Ear cyst removal    D & C  1976    bleeding    D & C  1988    miscarriage    TONSILLECTOMY AND ADENOIDECTOMY  1967    WISDOM  TOOTH EXTRACTION  1971       Current Outpatient Medications:     APPLE CIDER VINEGAR PO, Take by mouth every morning Takes 4 gummies at time, Disp: , Rfl:     Ascorbic Acid (vitamin C) 250 MG tablet, Take 500 mg by mouth every morning 2 250 mg in AM, total 500 mg, Disp: , Rfl:     B Complex Vitamins (B COMPLEX VITAMIN PO), Take by mouth every morning, Disp: , Rfl:     metFORMIN (GLUCOPHAGE) 500 MG tablet, Take 500 mg by mouth 2 (two) times daily with meals, Disp: , Rfl:     Vitamin D, Cholecalciferol, 50 MCG (2000 UT) Cap, Take 4,000 IU by mouth every morning, Disp: , Rfl:      Medication List            Accurate as of January 27, 2021 11:59 PM. Always use your most recent med list.                APPLE CIDER VINEGAR PO  Take by mouth every morning Takes 4 gummies at time  Medication Adjustments for Surgery: Stop 7 days before  surgery     B COMPLEX VITAMIN PO  Take by mouth every morning  Medication Adjustments for Surgery: Hold day of surgery     metFORMIN 500 MG tablet  Take 500 mg by mouth 2 (two) times daily with meals  Commonly known as: GLUCOPHAGE  Medication Adjustments for Surgery: Hold day of surgery     vitamin C 250 MG tablet  Take 500 mg by mouth every morning 2 250 mg in AM, total 500 mg  Medication Adjustments for Surgery: Hold day of surgery     Vitamin D (Cholecalciferol) 50 MCG (2000 UT) Caps  Take 4,000 IU by mouth every morning  Medication Adjustments for Surgery: Hold day of surgery            Allergies   Allergen Reactions    Ampicillin Nausea And Vomiting and Rash    Chlor-Trimeton [Chlorpheniramine] Nausea And Vomiting and Rash    Erythromycin Nausea And Vomiting and Rash    Penicillins Nausea And Vomiting and Rash    Phenergan [Promethazine] Nausea And Vomiting    Tetracycline Nausea And Vomiting and Rash     Social History     Occupational History    Not on file   Tobacco Use    Smoking status: Never    Smokeless tobacco: Never   Vaping Use    Vaping Use: Never used   Substance and Sexual  Activity    Alcohol use: Yes     Comment: socially    Drug use: Never    Sexual activity: Not on file       Menstrual History:   LMP / Status  Postmenopausal     No LMP recorded. Patient is postmenopausal.    Tubal Ligation?  No valid surgical or medical questions entered.             Exam Scores:   SDB score  Risk Category: No Risk    PONV score  Nausea Risk: SEVERE RISK    MST score  MST Score: 0    Allergy score  Risk Category: Moderate Risk    Frailty score  CFS Score: 3    MICA  MICA %: 0.08    RCRI score  RCRI Score: 0    DASI  DASI Score: 18.95  METs Level: 5.07       Visit Vitals  BP 126/83   Pulse 74   Ht 1.778 m (5\' 10" )   Wt 129.3 kg (285 lb)   SpO2 100%   BMI 40.89 kg/m       Review of Systems   Constitutional: Negative.    HENT: Negative.     Eyes: Negative.    Respiratory: Negative.     Cardiovascular: Negative.    Gastrointestinal: Negative.    Genitourinary: Negative.    Musculoskeletal: Negative.    Skin: Negative.    Neurological: Negative.    Endo/Heme/Allergies: Negative.    Psychiatric/Behavioral: Negative.       Physical Exam:  Mallampati: I  TM distance: > 3 FB (> 6 cm)  Mouth opening: > 3 FB (> 6 cm)  Neck extension: full  Enlarged neck circumference  Neck condition: goiter (Mulitnodular goiter present)    Normal dentition    Normal neurological exam  Mental status: alert and oriented x3  Speech: normal    Normal cardiovascular exam  Heart rhythm: regular  no murmur:    (-) a murmur and peripheral edema    Normal pulmonary exam  Breath  sounds clear to auscultation bilaterally    Normal thoracolumbar exam  Normal extremity exam    Abdominal Exam: Soft, non tender to palpation        Recent Labs   CBC (last 180 days) 11/10/20  1107 01/27/21  1422   WBC 4.82 6.51   Hgb 14.7 13.9   Hematocrit 45.3* 40.7   Platelets 288 278     Recent Labs   BMP (last 180 days) 11/10/20  1107 01/27/21  1422   Sodium 137  --    Potassium 4.1  --    Chloride 101  --    CO2 28  --    BUN 10.0  --    Glucose 151* 91      Recent Labs   ABG (last 180 days) 11/10/20  1107 01/27/21  1422   Hgb 14.7 13.9         Recent Labs   Other (last 180 days) 11/10/20  1107 12/11/20  0000   ALT 17  --    AST (SGOT) 17  --    Protein, Total 7.4  --    Hemoglobin A1C 8.3*  --    POCT Hgb A1C  --  7.1*       Recent Cardiac Imaging/Procedures:    ECG (11/10/20): NSR, LAD, HR 75        Anesthesia Plan:  ASA 2   Anesthetic Options Discussed: General  Potential anesthesia/Perioperative problems: No Problems Identified          A discussion with regarding next steps to prepare for the procedure and the planned anesthesia care took place during today's visit.  I explained that the patient will meet with their anesthesiology providers on the DOS.  Discussed with Patient

## 2021-01-27 NOTE — Pre-Procedure Instructions (Signed)
Important Instructions Before Your Procedure        Your case is currently scheduled for 02/11/2021 at 1000 with Krystal Clark, MD.      The date and/or time of your surgery may change.  Your surgeon's office will notify you, up until the business day before surgery, if there is any change to your surgery date or time.  Please don't hesitate to call your surgeon's office directly with any questions.        IMPORTANT You must visit the Preparing for Your Procedure guide link below for additional instructions including fasting guidelines and directions before your procedure. If you received fasting or skin preparation instructions from your surgeon or pre-procedural provider, please follow those specific instructions. The instructions here are general instructions that do not pertain to all patients.  [email or web address]    If the link doesn't open, please copy and paste in your browser

## 2021-02-08 ENCOUNTER — Encounter (FREE_STANDING_LABORATORY_FACILITY): Payer: BLUE CROSS/BLUE SHIELD

## 2021-02-08 DIAGNOSIS — E042 Nontoxic multinodular goiter: Secondary | ICD-10-CM

## 2021-02-09 LAB — COVID-19 (SARS-COV-2): SARS CoV 2 Overall Result: NOT DETECTED

## 2021-02-10 NOTE — Anesthesia Preprocedure Evaluation (Addendum)
Anesthesia Evaluation    AIRWAY    Mallampati: III    TM distance: >3 FB  Neck ROM: full  Mouth Opening:full  Planned to use difficult airway equipment: No CARDIOVASCULAR    cardiovascular exam normal       DENTAL    no notable dental hx     PULMONARY    pulmonary exam normal     OTHER FINDINGS    Large goiter            Relevant Problems   ENDO   (+) Type 2 diabetes mellitus without complication, without long-term current use of insulin       PSS Anesthesia Comments: Type 2 DM, increased BMI, large thyroid goiter - can not lie flat        Anesthesia Plan    ASA 2     general                                 informed consent obtained                   Signed by: Franco Nones, MD 02/10/21 7:52 PM

## 2021-02-11 ENCOUNTER — Ambulatory Visit
Admission: RE | Admit: 2021-02-11 | Discharge: 2021-02-12 | Disposition: A | Discharge status: Home / Self Care | Payer: BLUE CROSS/BLUE SHIELD | Source: Ambulatory Visit | Attending: Otolaryngology | Admitting: Otolaryngology

## 2021-02-11 ENCOUNTER — Ambulatory Visit: Payer: BLUE CROSS/BLUE SHIELD | Admitting: Critical Care Medicine

## 2021-02-11 ENCOUNTER — Encounter
Admission: RE | Disposition: A | Discharge status: Home / Self Care | Payer: Self-pay | Source: Ambulatory Visit | Attending: Otolaryngology

## 2021-02-11 ENCOUNTER — Ambulatory Visit: Payer: Self-pay

## 2021-02-11 DIAGNOSIS — E042 Nontoxic multinodular goiter: Secondary | ICD-10-CM | POA: Insufficient documentation

## 2021-02-11 HISTORY — PX: THYROIDECTOMY: SHX17

## 2021-02-11 LAB — GLUCOSE WHOLE BLOOD - POCT
Whole Blood Glucose POCT: 157 mg/dL — ABNORMAL HIGH (ref 70–100)
Whole Blood Glucose POCT: 107 mg/dL — ABNORMAL HIGH (ref 70–100)

## 2021-02-11 SURGERY — THYROIDECTOMY, TOTAL
Anesthesia: Anesthesia General | Site: Neck | Laterality: Left | Wound class: Clean

## 2021-02-11 MED ORDER — EPHEDRINE SULFATE 50 MG/ML IJ/IV SOLN (WRAP)
Status: DC | PRN
Start: 2021-02-11 — End: 2021-02-11
  Administered 2021-02-11: 10 mg via INTRAVENOUS

## 2021-02-11 MED ORDER — SUCCINYLCHOLINE CHLORIDE 20 MG/ML IJ SOLN
INTRAMUSCULAR | Status: DC | PRN
Start: 2021-02-11 — End: 2021-02-11
  Administered 2021-02-11: 200 mg via INTRAVENOUS

## 2021-02-11 MED ORDER — REMIFENTANIL HCL 1 MG IV SOLR
INTRAVENOUS | Status: AC
Start: 2021-02-11 — End: ?
  Filled 2021-02-11: qty 1000

## 2021-02-11 MED ORDER — GLYCOPYRROLATE 0.2 MG/ML IJ SOLN (WRAP)
INTRAMUSCULAR | Status: AC
Start: 2021-02-11 — End: ?
  Filled 2021-02-11: qty 1

## 2021-02-11 MED ORDER — ONDANSETRON HCL 4 MG/2ML IJ SOLN
4.0000 mg | Freq: Once | INTRAMUSCULAR | Status: DC | PRN
Start: 2021-02-11 — End: 2021-02-11

## 2021-02-11 MED ORDER — LIDOCAINE 4 % EX CREA
TOPICAL_CREAM | Freq: Once | CUTANEOUS | Status: DC | PRN
Start: 2021-02-11 — End: 2021-02-11

## 2021-02-11 MED ORDER — SUCCINYLCHOLINE CHLORIDE 20 MG/ML IJ/IV SOLN (WRAP)
Status: AC
Start: 2021-02-11 — End: ?
  Filled 2021-02-11: qty 5

## 2021-02-11 MED ORDER — LIDOCAINE-EPINEPHRINE 1 %-1:100000 IJ SOLN
INTRAMUSCULAR | Status: DC | PRN
Start: 2021-02-11 — End: 2021-02-11
  Administered 2021-02-11: 10 mL

## 2021-02-11 MED ORDER — HYDROMORPHONE HCL 0.5 MG/0.5 ML IJ SOLN
0.5000 mg | INTRAMUSCULAR | Status: DC | PRN
Start: 2021-02-11 — End: 2021-02-11

## 2021-02-11 MED ORDER — ACETAMINOPHEN 500 MG PO TABS
500.0000 mg | ORAL_TABLET | ORAL | Status: DC
Start: 2021-02-11 — End: 2021-02-12
  Administered 2021-02-11 – 2021-02-12 (×5): 500 mg via ORAL
  Filled 2021-02-11 (×5): qty 1

## 2021-02-11 MED ORDER — ACETAMINOPHEN 500 MG PO TABS
ORAL_TABLET | ORAL | Status: AC
Start: 2021-02-11 — End: ?
  Filled 2021-02-11: qty 2

## 2021-02-11 MED ORDER — ONDANSETRON HCL 4 MG/2ML IJ SOLN
INTRAMUSCULAR | Status: AC
Start: 2021-02-11 — End: ?
  Filled 2021-02-11: qty 2

## 2021-02-11 MED ORDER — KETAMINE HCL 50 MG/ML IJ SOLN
INTRAMUSCULAR | Status: AC
Start: 2021-02-11 — End: ?
  Filled 2021-02-11: qty 10

## 2021-02-11 MED ORDER — SODIUM CHLORIDE 0.9 % IR SOLN
Status: DC | PRN
Start: 2021-02-11 — End: 2021-02-11
  Administered 2021-02-11: 1000 mL

## 2021-02-11 MED ORDER — MIDAZOLAM HCL 1 MG/ML IJ SOLN (WRAP)
INTRAMUSCULAR | Status: DC | PRN
Start: 2021-02-11 — End: 2021-02-11
  Administered 2021-02-11 (×2): 1 mg via INTRAVENOUS

## 2021-02-11 MED ORDER — LIDOCAINE HCL 2 % IJ SOLN
INTRAMUSCULAR | Status: DC | PRN
Start: 2021-02-11 — End: 2021-02-11
  Administered 2021-02-11: 60 mg via INTRAVENOUS

## 2021-02-11 MED ORDER — ONDANSETRON 4 MG PO TBDP
4.0000 mg | ORAL_TABLET | Freq: Three times a day (TID) | ORAL | Status: DC | PRN
Start: 2021-02-11 — End: 2021-02-12

## 2021-02-11 MED ORDER — PHENYLEPHRINE 100 MCG/ML IV BOLUS (ANESTHESIA)
PREFILLED_SYRINGE | INTRAVENOUS | Status: DC | PRN
Start: 2021-02-11 — End: 2021-02-11
  Administered 2021-02-11 (×2): 50 ug via INTRAVENOUS

## 2021-02-11 MED ORDER — FENTANYL CITRATE (PF) 50 MCG/ML IJ SOLN (WRAP)
25.0000 ug | INTRAMUSCULAR | Status: DC | PRN
Start: 2021-02-11 — End: 2021-02-11
  Administered 2021-02-11 (×2): 25 ug via INTRAVENOUS

## 2021-02-11 MED ORDER — ONDANSETRON HCL 4 MG/2ML IJ SOLN
INTRAMUSCULAR | Status: DC | PRN
Start: 2021-02-11 — End: 2021-02-11
  Administered 2021-02-11: 4 mg via INTRAVENOUS

## 2021-02-11 MED ORDER — ACETAMINOPHEN 500 MG PO TABS
1000.0000 mg | ORAL_TABLET | Freq: Once | ORAL | Status: AC
Start: 2021-02-11 — End: 2021-02-11
  Administered 2021-02-11: 1000 mg via ORAL

## 2021-02-11 MED ORDER — REMIFENTANIL HCL 1 MG IV SOLR
INTRAVENOUS | Status: DC | PRN
Start: 2021-02-11 — End: 2021-02-11
  Administered 2021-02-11: .25 ug/kg/min via INTRAVENOUS

## 2021-02-11 MED ORDER — GLYCOPYRROLATE 0.2 MG/ML IJ SOLN (WRAP)
INTRAMUSCULAR | Status: DC | PRN
Start: 2021-02-11 — End: 2021-02-11
  Administered 2021-02-11: .2 mg via INTRAVENOUS

## 2021-02-11 MED ORDER — MIDAZOLAM HCL 1 MG/ML IJ SOLN (WRAP)
INTRAMUSCULAR | Status: AC
Start: 2021-02-11 — End: ?
  Filled 2021-02-11: qty 2

## 2021-02-11 MED ORDER — SODIUM CHLORIDE 0.9 % IV SOLN
INTRAVENOUS | Status: DC
Start: 2021-02-11 — End: 2021-02-12

## 2021-02-11 MED ORDER — LIDOCAINE HCL (PF) 2 % IJ SOLN
INTRAMUSCULAR | Status: AC
Start: 2021-02-11 — End: ?
  Filled 2021-02-11: qty 5

## 2021-02-11 MED ORDER — OXYCODONE HCL 5 MG PO TABS
5.0000 mg | ORAL_TABLET | Freq: Once | ORAL | Status: DC | PRN
Start: 2021-02-11 — End: 2021-02-11

## 2021-02-11 MED ORDER — PROPOFOL 10 MG/ML IV EMUL (WRAP)
INTRAVENOUS | Status: DC | PRN
Start: 2021-02-11 — End: 2021-02-11
  Administered 2021-02-11: 250 mg via INTRAVENOUS

## 2021-02-11 MED ORDER — FENTANYL CITRATE (PF) 50 MCG/ML IJ SOLN (WRAP)
INTRAMUSCULAR | Status: DC | PRN
Start: 2021-02-11 — End: 2021-02-11
  Administered 2021-02-11 (×2): 25 ug via INTRAVENOUS
  Administered 2021-02-11: 50 ug via INTRAVENOUS

## 2021-02-11 MED ORDER — FENTANYL CITRATE (PF) 50 MCG/ML IJ SOLN (WRAP)
INTRAMUSCULAR | Status: AC
Start: 2021-02-11 — End: ?
  Filled 2021-02-11: qty 2

## 2021-02-11 MED ORDER — AMMONIA AROMATIC IN INHA
1.0000 | Freq: Once | RESPIRATORY_TRACT | Status: DC | PRN
Start: 2021-02-11 — End: 2021-02-11

## 2021-02-11 MED ORDER — ENOXAPARIN SODIUM 40 MG/0.4ML IJ SOSY
40.0000 mg | PREFILLED_SYRINGE | Freq: Every day | INTRAMUSCULAR | Status: DC
Start: 2021-02-12 — End: 2021-02-12
  Administered 2021-02-12: 40 mg via SUBCUTANEOUS
  Filled 2021-02-11: qty 0.4

## 2021-02-11 MED ORDER — PROPOFOL 10 MG/ML IV EMUL (WRAP)
INTRAVENOUS | Status: AC
Start: 2021-02-11 — End: ?
  Filled 2021-02-11: qty 100

## 2021-02-11 MED ORDER — PROPOFOL 10 MG/ML IV EMUL (WRAP)
INTRAVENOUS | Status: AC
Start: 2021-02-11 — End: ?
  Filled 2021-02-11: qty 40

## 2021-02-11 MED ORDER — PHENYLEPHRINE 100 MCG/ML IV SYRINGE FOR INFUSION (ANESTHESIA)
PREFILLED_SYRINGE | INTRAVENOUS | Status: DC | PRN
Start: 2021-02-11 — End: 2021-02-11
  Administered 2021-02-11: 30 ug/min via INTRAVENOUS

## 2021-02-11 MED ORDER — REMIFENTANIL HCL 1 MG IV SOLR
INTRAVENOUS | Status: DC | PRN
Start: 2021-02-11 — End: 2021-02-11

## 2021-02-11 MED ORDER — NALOXONE HCL 0.4 MG/ML IJ SOLN (WRAP)
0.2000 mg | INTRAMUSCULAR | Status: DC | PRN
Start: 2021-02-11 — End: 2021-02-12

## 2021-02-11 MED ORDER — FENTANYL CITRATE (PF) 50 MCG/ML IJ SOLN (WRAP)
INTRAMUSCULAR | Status: AC
Start: 2021-02-11 — End: 2021-02-11
  Administered 2021-02-11: 25 ug via INTRAVENOUS
  Filled 2021-02-11: qty 2

## 2021-02-11 MED ORDER — CEPHALEXIN 500 MG PO CAPS
500.0000 mg | ORAL_CAPSULE | Freq: Two times a day (BID) | ORAL | 0 refills | Status: AC
Start: 2021-02-11 — End: 2021-02-14

## 2021-02-11 MED ORDER — LACTATED RINGERS IV SOLN
INTRAVENOUS | Status: DC
Start: 2021-02-11 — End: 2021-02-12

## 2021-02-11 MED ORDER — LIDOCAINE HCL 1 % IJ SOLN
1.0000 mL | Freq: Once | INTRAMUSCULAR | Status: DC | PRN
Start: 2021-02-11 — End: 2021-02-11

## 2021-02-11 MED ORDER — METFORMIN HCL 500 MG PO TABS
500.0000 mg | ORAL_TABLET | Freq: Two times a day (BID) | ORAL | Status: DC
Start: 2021-02-11 — End: 2021-02-12
  Administered 2021-02-11 – 2021-02-12 (×2): 500 mg via ORAL
  Filled 2021-02-11 (×2): qty 1

## 2021-02-11 MED ORDER — KETAMINE HCL 50 MG/ML IJ SOLN
INTRAMUSCULAR | Status: DC | PRN
Start: 2021-02-11 — End: 2021-02-11
  Administered 2021-02-11: 100 mg via INTRAVENOUS

## 2021-02-11 MED ORDER — ROCURONIUM BROMIDE 50 MG/5ML IV SOLN
INTRAVENOUS | Status: AC
Start: 2021-02-11 — End: ?
  Filled 2021-02-11: qty 5

## 2021-02-11 MED ORDER — OXYCODONE HCL 5 MG PO TABS
5.0000 mg | ORAL_TABLET | ORAL | Status: DC | PRN
Start: 2021-02-11 — End: 2021-02-12

## 2021-02-11 MED ORDER — ONDANSETRON HCL 4 MG/2ML IJ SOLN
4.0000 mg | Freq: Three times a day (TID) | INTRAMUSCULAR | Status: DC | PRN
Start: 2021-02-11 — End: 2021-02-12

## 2021-02-11 MED ORDER — OXYCODONE HCL 5 MG PO TABS
5.0000 mg | ORAL_TABLET | ORAL | 0 refills | Status: AC | PRN
Start: 2021-02-11 — End: 2021-02-18

## 2021-02-11 MED ORDER — ALBUMIN HUMAN/BIOSIMILIAR 5% IV SOLN (WRAP)
INTRAVENOUS | Status: DC | PRN
Start: 2021-02-11 — End: 2021-02-11

## 2021-02-11 SURGICAL SUPPLY — 72 items
ADH DRMBND ADV SKNCLS 2 OCTYL CYNCRLT .7 (Skin Closure) ×1
ADHESIVE SKIN CLOSURE DERMABOND ADVANCED (Skin Closure) ×1
ADHESIVE SKIN CLOSURE DERMABOND ADVANCED .7 ML LIQUID APPLICATOR (Skin Closure) ×1 IMPLANT
APPLIER INTERNAL CLIP L9.75 IN AUTOMATIC (Procedure Accessories) ×1
APPLIER INTERNAL CLIP L9.75 IN AUTOMATIC PREMIUM SURGICLIP II SUPER (Procedure Accessories) ×1 IMPLANT
APR PRM SGCLP II SUP INTLK IN CLP 9.75IN (Procedure Accessories) ×1
BULB DRAINAGE LIGHTWEIGHT LOW LEVEL (Drain)
BULB DRAINAGE LIGHTWEIGHT LOW LEVEL SUCTION RELIAVAC SILICONE 100 CC (Drain) IMPLANT
CORD BIPOLAR STRL DISP 12FT (Cautery) ×1
CORD ELECTROSURGICAL GREEN 12FT PLASTIC BIPOLAR STERILE DISPOSABLE (Cautery) ×1 IMPLANT
CORD ESURG PLS 12FT STRL BP DISP GRN (Cautery) ×1
DRAIN BLAKE FLAT 10MM (Drain)
DRAIN RADIOPAQUE 4 FREE FLOW CHANNEL (Drain)
DRAIN RADIOPAQUE 4 FREE FLOW CHANNEL BARD L3/8 IN INCISION SILICONE (Drain) IMPLANT
DRAN EVACUATOR WOUND 100CC (Drain)
DRAPE 66X44IN POLYURETHANE WARMER ORS ORS FLUID WARMING STRL DSPSBL (Drape) ×1 IMPLANT
DRAPE ORS WRMR 66X44IN PU STRL DISP ORS (Drape) ×1
DRAPE WRMR PU ORS 66X44IN STRL DISP ORS (Drape) ×1
GLOVE BGL PI MIC SRG 288MM 7 PLISPRN PF (Glove) ×1
GLOVE SURGICAL 7 BIOGEL PI MICRO POWDER (Glove) ×1
GLOVE SURGICAL 7 BIOGEL PI MICRO POWDER FREE BEAD CUFF MICRO ROUGHEN (Glove) ×1 IMPLANT
PENCIL SMOKE EVACUATOR COATED PUSH (Cautery) ×1
PENCIL SMOKE EVACUATOR COATED PUSH BUTTON NEPTUNE E-SEP (Cautery) ×1 IMPLANT
PNCL SMKEVC COAT PSHBTN LF (Cautery) ×1
PROBE NERVE STIMULATOR STANDARD (Nervemon) ×1 IMPLANT
PROBE NM RSPN 20 NIM-NEURO 20 NS STD (Nervemon) ×1
SHEAR HARMONIC FOCUS+ CRVD 9CM (Cautery) ×1
SHEARS ELECTROSURGICAL L9 CM CURVE TAPER (Cautery) ×1
SHEARS ELECTROSURGICAL L9 CM CURVE TAPER 2 HANDCONTROL SOFT GRIP (Cautery) ×1 IMPLANT
SOL BDINE PRP 10% PVP IOD 8OZ SKN BTL (Prep) ×1
SOLUTION PREP BETADINE 10% PVP IODINE 8OZ BOTTLE SKIN (Prep) ×1 IMPLANT
SOLUTION PRP 10% PVP IOD 8OZ BDINE BTL (Prep) ×1
SPNG CRTY GZE 4X4IN PLS CTTN 16 PLY MAX (Sponge) ×1
SPNG KRLX GZE 6.75X6IN MED CTTN ABS FLF (Dressing) ×1
SPNG LAP 18X18IN CTTN 7IN 4 PLY RADOPQ (Sponge) ×1
SPONGE GAUZE L4 IN X W4 IN 16 PLY (Sponge) ×1
SPONGE GAUZE L4 IN X W4 IN 16 PLY MAXIMUM ABSORBENT TRAY CURITY (Sponge) ×1 IMPLANT
SPONGE GAUZE L6 3/4 IN X W6 IN MEDIUM (Dressing) ×1
SPONGE GAUZE L6 3/4 IN X W6 IN MEDIUM ABSORBENT FLUFF DRY CRINKLE (Dressing) ×1 IMPLANT
SPONGE LAPAROTOMY L18 IN X W18 IN 4 PLY (Sponge) ×1
SPONGE LAPAROTOMY L18 IN X W18 IN 4 PLY RADIOPAQUE PYRONEMA FREE HIGH (Sponge) ×1 IMPLANT
SPONGE PEANUT C5 HOLDER DISSECTOR XRAY (Sponge) ×2
SPONGE PEANUT C5 HOLDER DISSECTOR XRAY DETECTABLE OD3/8 IN DUKAL (Sponge) ×2 IMPLANT
SPONGE PEANUT RADPQE STRL3/8IN (Sponge) ×2
STAPLER SKIN L4.1 MM X W6.5 MM 35 WIDE (Staplers) ×1
STAPLER SKIN L4.1 MM X W6.5 MM 35 WIDE STAPLE CARTRIDGE APPOSE ULC (Staplers) ×1 IMPLANT
STPLR APS U SKN 4.1X6.5MM SS PLS 35 W (Staplers) ×1
SUT PRMHND NABSB 18IN 2-0 SH SLK CR BRD (Suture) ×1
SUT PRMHND NABSB 18IN 2-0 SLK BRD TIE 12 (Suture) ×1
SUT PRMHND NABSB 18IN 3-0 SLK BRD TIE 12 (Suture) ×1
SUT PRMHND NABSB 18IN 4-0 SLK BRD TIE 12 (Suture) ×1
SUT VCL ABS 27IN 3-0 RB1 BRD COAT UD (Suture) ×1
SUT VCL ABS 27IN 4-0 RB1 BRD COAT VIOL (Suture) ×1
SUTURE COATED VICRYL 3-0 RB-1 L27 IN (Suture) ×1
SUTURE COATED VICRYL 3-0 RB-1 L27 IN BRAID COATED UNDYED ABSORBABLE (Suture) ×1 IMPLANT
SUTURE COATED VICRYL 4-0 RB-1 L27 IN (Suture) ×1
SUTURE COATED VICRYL 4-0 RB-1 L27 IN BRAID COATED VIOLET ABSORBABLE (Suture) ×1 IMPLANT
SUTURE SILK PERMA HAND BLACK 2-0 L18 IN (Suture) ×1
SUTURE SILK PERMA HAND BLACK 2-0 L18 IN BRAID TIES 12 STRAND PRECUT (Suture) ×1 IMPLANT
SUTURE SILK PERMA HAND BLACK 2-0 SH L18 (Suture) ×1
SUTURE SILK PERMA HAND BLACK 3-0 L18 IN (Suture) ×1
SUTURE SILK PERMA HAND BLACK 3-0 L18 IN BRAID TIES 12 STRAND PRECUT (Suture) ×1 IMPLANT
SUTURE SILK PERMA HAND BLACK 4-0 L18 IN (Suture) ×1
SUTURE SILK PERMA HAND BLACK 4-0 L18 IN BRAID TIES 12 STRAND PRECUT (Suture) ×1 IMPLANT
SUTURE SILK PERMA HAND BLCK 2-0 SH L18 IN CONTROL BRD 8 STRND NNBSRBBL (Suture) ×1 IMPLANT
TOWEL L27 IN X W17 IN COTTON PREWASH (Other) ×1
TOWEL L27 IN X W17 IN COTTON PREWASH DELINT HIGH ABSORBENT BLUE (Other) ×1 IMPLANT
TOWEL SRG 27X17IN CTTN PREWASH DELINT HI (Other) ×1
TRAY NASAL ORAL HEAD NCK ~~LOC~~ (Pack) ×1
TRAY PREOPERATIVE SKIN DRY SCRUB (Tray) ×2 IMPLANT
TRAY SRG NASAL ORAL HEAD NCK ~~LOC~~ (Pack) ×1
TRAY SURGICAL NASAL ORAL HEAD NEACK (Pack) ×1 IMPLANT

## 2021-02-11 NOTE — Transfer of Care (Signed)
Anesthesia Transfer of Care Note    Patient: Gabriella Dean    Procedures performed: Procedure(s):  LEFT THYROIDECTOMY    Anesthesia type: General ETT    Patient location:Phase I PACU    Last vitals:   Vitals:    02/11/21 1442   BP: 146/70   Pulse: 95   Resp: 17   Temp: 36.3 C (97.3 F)   SpO2: 100%       Post pain: Patient not complaining of pain, continue current therapy      Mental Status:awake and alert     Respiratory Function: tolerating face mask    Cardiovascular: stable    Nausea/Vomiting: patient not complaining of nausea or vomiting    Hydration Status: adequate    Post assessment: no apparent anesthetic complications, no reportable events and no evidence of recall    Signed by: Rosalyn Gess, CRNA  02/11/21 2:42 PM

## 2021-02-11 NOTE — H&P (Signed)
Patient seen and examined.  H&P reviewed reviewed. No changes.

## 2021-02-11 NOTE — Op Note (Signed)
Procedure Date: 02/11/2021     Patient Type: A     SURGEON: Krystal Clark MD         ASSISTANT:  Vanessa Durham, MD.     PREOPERATIVE DIAGNOSIS:  Multinodular goiter.     POSTOPERATIVE DIAGNOSIS:  Multinodular goiter.     TITLE OF PROCEDURE:  Left thyroid lobectomy.     ANESTHESIA:  General.     BLOOD LOSS:  100 mL.     COMPLICATIONS:  None.     DESCRIPTION OF PROCEDURE:  The patient was brought to the operating room and identified by name and  medical record number.  After obtaining a plan of general endotracheal  anesthesia, a surgical pause was performed.  Universal protocol was  followed.  NIMS tube was used for intubation, and there was a NIMS nerve  monitoring tube was used for intubation and found to be in working order.   A 10-cm incision was marked out 2 fingerbreadths above the sternal notch.   This was then injected with 1% lidocaine with 1:100,000 epinephrine.  After  waiting appropriate amount of time, an incision was made through the skin  and subcutaneous tissue.  I raised superior and inferior subplatysmal  flaps.  The midline was deviated to the right given the large size of the  left multinodular goiter.  The midline was identified allowing dissection  of the strap muscles laterally.  While attempting to lateralize the left  strap muscles, it became apparent that I would not be able to reach the  superior and lateral most extent of the large nodule with the strap muscles  intact.  Therefore, I made the decision to transect the strap muscles to  allow better exposure.  With the strap muscles dissected, it was evident  that she had a very large nodule filling the left side of her neck.  As I  was working to free the superior pole, the nodule ruptured allowed [place] to  core out the contents and decompress part of the nodule to allow better  mobilization.  It initially appeared that the nodule was the entire goiter.   However, as we were working around the nodule, it was evident that it was  seated  within additional thyroid tissue that had several other nodules.   Given the large size of the goiter, I debulked the central components  including the largest size nodule and passed this off the table for  permanent tissue analysis.  With this more central component removed, I was  then able to get around the lateral and superior aspect of  the left  thyroid lobe.  I started with the superior pole, by retracting this  inferiorly to identify the vascular pedicle of the superior pole, which was  transected using the Harmonic scalpel.  I then was able to roll the  superior and lateral edge of the thyroid medially to identify the recurrent  laryngeal nerve as it entered the cricothyroid joint.  Once this was  identified, I continued to dissect the gland from its lateral fascial  attachments and middle thyroid vein.  The dissection was continued medially  onto the trachea.  Once this had been completely mobilized down to the  trachea, it was removed in its entirety and passed off the table for  permanent tissue analysis.     The wound was copiously irrigated.  The recurrent laryngeal nerve was  visualized and intact and stimulated well with the nerve stimulator.   Unfortunately, I was  not able to identify any parathyroid glands on the  specimen, nor in the tissue bed.     Given the extensive nature of the goiter and concern for injury to the left  parathyroid glands, I elected to not proceed with the right thyroid  lobectomy.  The wound was copiously irrigated.  Any points of bleeding were  meticulously cauterized.  A 7 flat channel JP drain was placed into the  left neck.  The transected left strap muscles were reapproximated with a  4-0 Vicryl horizontal mattress suture; this was placed in both the  superficial and deep strap muscles.  The strap muscles were then  reapproximated in the midline to the right strap muscles.  The incision was  then closed with 3-0 Vicryl deep sutures, 4-0 Vicryl subdermal sutures  and  Dermabond at the skin.  This indicated the termination of the procedure.   The patient tolerated the procedure well.  There were no complications.   Her care was turned over to anesthesia for awakening.           D:  02/11/2021 15:13 PM by Dr. [place]oreatha [place] ([id number])  T:  02/11/2021 15:24 PM by NTS      Everlean Cherry: [id number]) (Doc ID: [id number])

## 2021-02-11 NOTE — Addendum Note (Signed)
Addendum  created 02/11/21 1742 by Fayette Pho, MD    Clinical Note Signed

## 2021-02-11 NOTE — Anesthesia Postprocedure Evaluation (Signed)
Anesthesia Post Evaluation    Patient: Gabriella Dean    Procedure(s):  LEFT THYROIDECTOMY    Anesthesia type: general    Last Vitals:   Vitals Value Taken Time   BP 144/68 02/11/21 1650   Temp 36.3 C (97.3 F) 02/11/21 1442   Pulse 84 02/11/21 1650   Resp 15 02/11/21 1650   SpO2 99 % 02/11/21 1650                 Anesthesia Post Evaluation:     Patient Evaluated: PACU    Level of Consciousness: awake and alert    Pain Management: adequate    Airway Patency: patent    Anesthetic complications: No      PONV Status: none    Cardiovascular status: acceptable  Respiratory status: acceptable  Hydration status: acceptable        Signed by: Franco Nones, MD, 02/11/2021 4:59 PM

## 2021-02-11 NOTE — PACU (Signed)
Patient verbalized that there is numbness on the left hand fingers except the little finger and informed Dr Lynnea Ferrier. Dr Loletha Grayer came and reviewed the patient. She explained that it may be due to the positioning and to observe that there is nowhere else numbness is felt. Patient is convinced about explanation.

## 2021-02-11 NOTE — Discharge Instr - AVS First Page (Signed)
Reason for your Hospital Admission:  surgery      Instructions for after your discharge:  THYROIDECTOMY POST OPERATIVE INSTRUCTIONS    Medications:  Your surgeon will write prescriptions for pain medicine and possibly for an antibiotic.  Please notify the office if you believe you may be having a reaction to a medication.  For mild discomfort, Tylenol or acetaminophen may be taken for pain.  Some prescription pain medications such as Percocet or Vicodin contain Tylenol - be sure to limit Tylenol consumption from all sources to less than 3000 mg in a 24 hr period.  Ibuprofen and Aspirin products can thin the blood and promote bleeding.  If you have been instructed to take aspirin by another health care provider for stroke/prevention or a cardiac condition, check with your surgeon. It is recommended to take an over the counter stool softener while on oral narcotic pain medications to prevent constipation. If you develop diarrhea, simply discontinue the stool softener.      Diet:  Begin with liquids. Diet may be advanced after any nausea or vomiting subsides.      Activity: Sleep with the head elevated for the first 48 hours. You may use two pillows to do this or sleep in a reclining chair. Gentle rotation, flexion and extension of the head and neck are permitted. You should not lift more than 10 pounds for 2 weeks.  No strenuous activity for 2 weeks.  You should walk as much as can, but avoid running or any activity more strenuous than walking for 2 weeks.    Incision care:    If you have sutures: The dressing may be removed the day after surgery.  The incision should not get wet for 48 hours following surgery, after which time the incision can be gently cleaned with soap and water.  Do not let the shower water strike the incision directly, rather let it run over the wound, and blot dry.  Hydrogen peroxide can be used to remove crusting if necessary.  A thin layer of antibiotic ointment or Vaseline may be applied to  prevent crusting and promote healing.  The incision should be kept out of the sun or covered by SPF 30+ for one year after surgery to promote favorable scar appearance.  If you have tissue glue: The incision should not get wet for 48 hours following surgery, after which time the incision can be gently cleaned with soap and water.  Do not let the shower water strike the incision directly, rather let it run over the wound, and blot dry.  Do not scrub the wound.  You may notice the glue will peal after several days.  The glue will disintegrate over time.  Do not apply Vaseline or other ointments to the skin, as this can dissolve the adhesive.  The incision should be kept out of the sun or covered by SPF 30+ for one year after surgery to promote favorable scar appearance.    Infection:  Contact the office if you are experiencing and signs or symptoms of infection including fever (>101 degrees for 24 hours), severe pain, tenderness, swelling, warmth, or purulent drainage.      Calcium levels may be altered after surgery.  Signs of low calcium levels include persistent muscle spasms, twitches, or cramps in the hands, face and legs and numbness/tingling in the fingertips or around the lips.  If you experience these symptoms, take 2 TUMS chewable calcium tablets and notify our office immediately.  Your surgeon may   prescribe calcium supplementation following surgery.    Hoarseness or difficulty swallowing may be present following surgery.  If this persists longer than two weeks, alert your surgeon.    Follow up appointment:  Your surgeon will tell you when to return to the office for a post-operative visit.  Please contact the office to arrange this appointment.  Do not drive yourself for this first post op visit if you are taking prescription pain medication; instead, arrange to have a family member or friend bring you.  Our office staff will gladly assist in answering any additional general questions you may have when you  phone to schedule your post operative visit.  Your surgeon will answer specific questions about your operation when you are seen.

## 2021-02-11 NOTE — Brief Op Note (Signed)
BRIEF OP NOTE    Date Time: 02/11/21 2:25 PM    Patient Name:   Gabriella Dean    Date of Operation:   02/11/2021    Providers Performing:   Surgeon(s):  Krystal Clark, MD  Roeser, Florestine Avers, MD    Assistant (s):   Circulator: Jonell Cluck, RN  Relief Circulator: Dorene Sorrow, Juliette Alcide, RN; McKeever, [place], RN  Relief Scrub: Raye Sorrow, RN  Scrub Person: Carmon Ginsberg; Sherry Ruffing, RN  First Assistant: Robyn Haber    Operative Procedure:   Procedure(s):  LEFT THYROIDECTOMY    Preoperative Diagnosis:   Pre-Op Diagnosis Codes:     * Multinodular goiter [E04.2]    Postoperative Diagnosis:   Post-Op Diagnosis Codes:     * Multinodular goiter [E04.2]    Anesthesia:   General    Estimated Blood Loss:    200 mL    Implants:   * No implants in log *    Drains:   Drains: Yes, Drain #1: [place] Flat 7 MM    Specimens:     ID Type Source Tests Collected by Time Destination   A : Left Thyroid  Lobectomy Thyroid SURGICAL PATHOLOGY Krystal Clark, MD 02/11/2021 1223    B : Left Parathyroid? Biopsy Parathyroid SURGICAL PATHOLOGY Krystal Clark, MD 02/11/2021 1243          Findings:   See dictated note    Complications:   none      Signed by: Krystal Clark, MD                                                                           [facility]

## 2021-02-11 NOTE — Anesthesia Postprocedure Evaluation (Signed)
Anesthesia Post Evaluation    Patient: Gabriella Dean    Procedure(s):  LEFT THYROIDECTOMY    Anesthesia type: general    Last Vitals:   Vitals Value Taken Time   BP 147/65 02/11/21 1720   Temp 36.3 C (97.3 F) 02/11/21 1442   Pulse 90 02/11/21 1720   Resp 18 02/11/21 1720   SpO2 99 % 02/11/21 1720               Patient evaluated in PACU for numbness of L digits (every digit but the pinky). No weakness and no pain. Good pulses. I explained to the patient that this was most likely caused by positioning and will be temporary. However, if numbness and tingling occurs in other extremities, she should be alert the staff. Will continue to monitor.         Anesthesia Post Evaluation    Signed by: Fayette Pho, MD, 02/11/2021 5:39 PM

## 2021-02-11 NOTE — Plan of Care (Signed)
Procedure(s):  LEFT THYROIDECTOMY    Day of Surgery  -------------------     Neuro: A/OX4  CV: no tele  Resp: RA  GU: voided upon arrival to unit  GI: no c/o n/v; pain w/ swallowing  Integ: anterior neck incision - dermabond, c/d/I; JPx1 to L neck - serosanguinous output  Musc: ambulates well w/ SBA  Isolation: none  Pain: reports mild pain to neck - admin scheduled tylenol  Access: PIV to R hand - infusing NS @ 75 ml/hr  Safety: high fall risk - fall mat in place, call bell w/in reach, bed alarm on  Interpreter: n/a  CHG: n/a    NOTE:  Pain management  IVF  Anticipate d/c 9/2 per Dr Judeen Hammans    Problem: Safety  Goal: Patient will be free from injury during hospitalization  Outcome: Progressing  Flowsheets (Taken 02/11/2021 1818)  Patient will be free from injury during hospitalization:   Assess patient's risk for falls and implement fall prevention plan of care per policy   Provide and maintain safe environment   Ensure appropriate safety devices are available at the bedside   Use appropriate transfer methods   Include patient/ family/ care giver in decisions related to safety   Hourly rounding   Provide alternative method of communication if needed (communication boards, writing)   Assess for patients risk for elopement and implement Elopement Risk Plan per policy  Goal: Patient will be free from infection during hospitalization  Outcome: Progressing  Flowsheets (Taken 02/11/2021 1818)  Free from Infection during hospitalization:   Assess and monitor for signs and symptoms of infection   Monitor lab/diagnostic results   Encourage patient and family to use good hand hygiene technique   Monitor all insertion sites (i.e. indwelling lines, tubes, urinary catheters, and drains)     Problem: Pain  Goal: Pain at adequate level as identified by patient  Outcome: Progressing  Flowsheets (Taken 02/11/2021 1818)  Pain at adequate level as identified by patient:   Identify patient comfort function goal   Assess pain on admission,  during daily assessment and/or before any "as needed" intervention(s)   Reassess pain within 30-60 minutes of any procedure/intervention, per Pain Assessment, Intervention, Reassessment (AIR) Cycle   Assess for risk of opioid induced respiratory depression, including snoring/sleep apnea. Alert healthcare team of risk factors identified.   Offer non-pharmacological pain management interventions   Evaluate patient's satisfaction with pain management progress   Evaluate if patient comfort function goal is met   Include patient/patient care companion in decisions related to pain management as needed     Problem: Side Effects from Pain Analgesia  Goal: Patient will experience minimal side effects of analgesic therapy  Outcome: Progressing  Flowsheets (Taken 02/11/2021 1818)  Patient will experience minimal side effects of analgesic therapy:   Monitor/assess patient's respiratory status (RR depth, effort, breath sounds)   Assess for changes in cognitive function   Prevent/manage side effects per LIP orders (i.e. nausea, vomiting, pruritus, constipation, urinary retention, etc.)   Evaluate for opioid-induced sedation with appropriate assessment tool (i.e. POSS)     Problem: Discharge Barriers  Goal: Patient will be discharged home or other facility with appropriate resources  Outcome: Progressing  Flowsheets (Taken 02/11/2021 1818)  Discharge to home or other facility with appropriate resources:   Provide appropriate patient education   Provide information on available health resources   Initiate discharge planning     Problem: Psychosocial and Spiritual Needs  Goal: Demonstrates ability to cope with hospitalization/illness  Outcome: Progressing  Flowsheets (Taken 02/11/2021 1818)  Demonstrates ability to cope with hospitalizations/illness:   Encourage verbalization of feelings/concerns/expectations   Include patient/ patient care companion in decisions   Provide quiet environment     Problem: Moderate/High Fall Risk Score  >5  Goal: Patient will remain free of falls  Outcome: Progressing  Flowsheets (Taken 02/11/2021 1813)  High (Greater than 13):   HIGH-Consider use of low bed   HIGH-Initiate use of floor mats as appropriate   HIGH-Utilize chair pad alarm for patient while in the chair   HIGH-Bed alarm on at all times while patient in bed   HIGH-Visual cue at entrance to patient's room   MOD-Place Fall Risk level on whiteboard in room   MOD-Include family in multidisciplinary POC discussions   MOD-Perform dangle, stand, walk (DSW) prior to mobilization     Problem: Compromised Tissue integrity  Goal: Damaged tissue is healing and protected  Outcome: Progressing  Flowsheets (Taken 02/11/2021 1818)  Damaged tissue is healing and protected:   Monitor/assess Braden scale every shift   Avoid shearing injuries   Keep intact skin clean and dry   Use bath wipes, not soap and water, for daily bathing   Monitor external devices/tubes for correct placement to prevent pressure, friction and shearing  Goal: Nutritional status is improving  Outcome: Progressing  Flowsheets (Taken 02/11/2021 1818)  Nutritional status is improving:   Allow adequate time for meals   Include patient/patient care companion in decisions related to nutrition     Problem: Inadequate Airway Clearance  Goal: Normal respiratory rate/effort achieved/maintained  Outcome: Progressing  Flowsheets (Taken 02/11/2021 1818)  Normal respiratory rate/effort achieved/maintained: Plan activities to conserve energy: plan rest periods     Problem: Inadequate Tissue Perfusion-Venous  Goal: Tissue perfusion is adequate-venous  Outcome: Progressing  Flowsheets (Taken 02/11/2021 1818)  Tissue perfusion is adequate-venous: Increase activity as tolerated / progressive mobility

## 2021-02-12 ENCOUNTER — Encounter: Payer: Self-pay | Admitting: Otolaryngology

## 2021-02-12 LAB — PTH, INTACT: PTH Intact: 77.8 pg/mL (ref 17.7–84.5)

## 2021-02-12 LAB — CALCIUM: Calcium: 8.7 mg/dL (ref 8.5–10.5)

## 2021-02-12 NOTE — UM Notes (Signed)
Ambulatory review  Unit: Short stay/ [facility]    Vitals Value Taken Time   BP 147/65 02/11/21 1720   Temp 36.3 C (97.3 F) 02/11/21 1442   Pulse 90 02/11/21 1720   Resp 18 02/11/21 1720   SpO2 99 % 02/11/21 1720     Operative Procedure:   Procedure(s):  LEFT THYROIDECTOMY     Preoperative Diagnosis:   Pre-Op Diagnosis Codes:     * Multinodular goiter [E04.2]    Pollyann Kennedy, RN, BSN CNRN  UR Case Manager   Utilization Review  [facility]   [address]  [address], [address]  [place], [place] [address]  Phone : [phone] VM only  [facility] [place] [phone]  Fax: [phone]    [email or web address]

## 2021-02-12 NOTE — Progress Notes (Signed)
[facility] DISCHARGE NOTE  [facility] admission < 48 hours)    Date:02/12/2021   Patient Name: Gabriella Dean  Attending Physician: Krystal Clark, MD  Today:   BP 136/84   Pulse 82   Temp 98.3 F (36.8 C) (Oral)   Resp 14   Ht 1.778 m (5\' 10" )   Wt 128 kg (282 lb 3 oz)   SpO2 98%   BMI 40.49 kg/m   Ranges for the last 24 hours:  Temp:  [97.3 F (36.3 C)-99.4 F (37.4 C)] 98.3 F (36.8 C)  Heart Rate:  [75-110] 82  Resp Rate:  [13-22] 14  BP: (118-148)/(54-84) 136/84    Date of Admission:   02/11/2021    Date of Discharge:   02/12/21    Outcome of Hospitalization:   Principal Problem:    Multinodular goiter  Resolved Problems:    * No resolved hospital problems. *     Significant Events: s/p left hemithyroidectomy. No acute event in postoperative setting.    Lab Results last 48 Hours       Procedure Component Value Units Date/Time    PTH, Intact [[id number]] Collected: 02/12/21 0238    Specimen: Blood Updated: 02/12/21 0527     PTH Intact 77.8 pg/mL     Calcium [[id number]] Collected: 02/12/21 0238    Specimen: Blood Updated: 02/12/21 0318     Calcium 8.7 mg/dL     Glucose Whole Blood - POCT [[id number]]  (Abnormal) Collected: 02/11/21 1450     Updated: 02/11/21 1453     Whole Blood Glucose POCT 107 mg/dL     Glucose Whole Blood - POCT [[id number]]  (Abnormal) Collected: 02/11/21 0937     Updated: 02/11/21 0940     Whole Blood Glucose POCT 157 mg/dL             Procedures performed:   Surgery: all results from this admission  Procedure(s):  LEFT THYROIDECTOMY (Left)    Treatment Team:   Attending Provider: Krystal Clark, MD    Disposition:   Disposition:      Condition at Discharge:   good     Follow up Recommendations for Receiving Provider     Follow up for drain removal when output less than 30cc/24h period  Unresulted Labs       None            Discharge Instructions:      Follow-up Information       Krystal Clark, MD Follow up in 1 day(s).    Specialty: Otorhinolaryngology  Why: drain  removal  Contact information:  [address]  [place] [address]  [phone]               Doyle Askew, MD .    Specialty: Family Medicine  Contact information:  [address]  [place] [address]  [phone]                           Peripheral IV 02/11/21 20 G Right Hand (Active)   Site Assessment Clean;Intact;Dry 02/11/21 1835   Line Status Flushed;Infusing 02/11/21 1835   Tubing Dated? Yes 02/11/21 1835   Dressing Status Clean;Dry;Intact 02/11/21 1835   Dressing Dated? No 02/11/21 1750   Dressing Change Due 02/17/21 02/11/21 1835   Reason Not Rotated Not due 02/11/21 1835   Number of days: 0     Discharge References/Attachments  None          Discharge Medication List        Taking      APPLE CIDER VINEGAR PO  Take by mouth every morning Takes 4 gummies at time     B COMPLEX VITAMIN PO  Take by mouth every morning     cephalexin 500 MG capsule  Dose: 500 mg  Commonly known as: KEFLEX  Take 1 capsule (500 mg total) by mouth 2 (two) times daily for 3 days     metFORMIN 500 MG tablet  Dose: 500 mg  Commonly known as: GLUCOPHAGE  Take 500 mg by mouth 2 (two) times daily with meals     oxyCODONE 5 MG immediate release tablet  Dose: 5 mg  Commonly known as: ROXICODONE  Take 1 tablet (5 mg total) by mouth every 4 (four) hours as needed for Pain     vitamin C 250 MG tablet  Dose: 500 mg  Take 500 mg by mouth every morning 2 250 mg in AM, total 500 mg     Vitamin D (Cholecalciferol) 50 MCG (2000 UT) Caps  Dose: 4,000 IU  Take 4,000 IU by mouth every morning              Signed by: Lucilla Edin, MD

## 2021-02-12 NOTE — Progress Notes (Signed)
Aaox4  VSS  [place] instruction and education given to pt   IV removed

## 2021-02-12 NOTE — Plan of Care (Signed)
A/O: AOx4  Mobility: standby assist per report  Skin check: neck incision, approximated dry and intact  Foley/drains/central lines: JPx1 to left neck, small serosanguinous output  Fall Risk: Moderate risk; fall mat in place, call bell and belonging within reach  Shift note: patient report minimum pain to the neck, pain is controlled with scheduled tylenol. Tingling sensation to the tips of first four fingers on the left. Explained plan of care to patient.   Plan: AM labs, scheduled tylenol, IVF. Discharge.       Problem: Safety  Goal: Patient will be free from injury during hospitalization  Outcome: Progressing  Flowsheets (Taken 02/12/2021 0206)  Patient will be free from injury during hospitalization:   Assess patient's risk for falls and implement fall prevention plan of care per policy   Use appropriate transfer methods   Include patient/ family/ care giver in decisions related to safety   Ensure appropriate safety devices are available at the bedside   Provide and maintain safe environment   Hourly rounding   Assess for patients risk for elopement and implement Elopement Risk Plan per policy  Goal: Patient will be free from infection during hospitalization  Outcome: Progressing  Flowsheets (Taken 02/12/2021 0206)  Free from Infection during hospitalization:   Assess and monitor for signs and symptoms of infection   Encourage patient and family to use good hand hygiene technique   Monitor lab/diagnostic results   Monitor all insertion sites (i.e. indwelling lines, tubes, urinary catheters, and drains)     Problem: Pain  Goal: Pain at adequate level as identified by patient  Outcome: Progressing  Flowsheets (Taken 02/12/2021 0206)  Pain at adequate level as identified by patient:   Assess for risk of opioid induced respiratory depression, including snoring/sleep apnea. Alert healthcare team of risk factors identified.   Identify patient comfort function goal   Assess pain on admission, during daily assessment and/or  before any "as needed" intervention(s)   Evaluate if patient comfort function goal is met   Evaluate patient's satisfaction with pain management progress     Problem: Discharge Barriers  Goal: Patient will be discharged home or other facility with appropriate resources  Outcome: Progressing  Flowsheets (Taken 02/12/2021 0206)  Discharge to home or other facility with appropriate resources: Provide appropriate patient education     Problem: Psychosocial and Spiritual Needs  Goal: Demonstrates ability to cope with hospitalization/illness  Outcome: Adequate for Discharge     Problem: Moderate/High Fall Risk Score >5  Goal: Patient will remain free of falls  Outcome: Progressing  Flowsheets (Taken 02/11/2021 2300)  Moderate Risk (6-13): MOD-Floor mat at bedside (where available) if appropriate     Problem: Compromised Tissue integrity  Goal: Damaged tissue is healing and protected  Outcome: Progressing  Flowsheets (Taken 02/12/2021 0206)  Damaged tissue is healing and protected:   Monitor/assess Braden scale every shift   Increase activity as tolerated/progressive mobility   Relieve pressure to bony prominences for patients at moderate and high risk   Avoid shearing injuries   Keep intact skin clean and dry  Goal: Nutritional status is improving  Outcome: Progressing  Flowsheets (Taken 02/12/2021 0206)  Nutritional status is improving: Allow adequate time for meals     Problem: Inadequate Airway Clearance  Goal: Normal respiratory rate/effort achieved/maintained      Outcome: Progressing  Flowsheets (Taken 02/12/2021 0208)  Normal respiratory rate/effort achieved/maintained: Plan activities to conserve energy: plan rest periods

## 2021-02-16 ENCOUNTER — Encounter (INDEPENDENT_AMBULATORY_CARE_PROVIDER_SITE_OTHER): Payer: Self-pay | Admitting: Family Medicine

## 2021-02-25 LAB — LAB USE ONLY - HISTORICAL SURGICAL PATHOLOGY

## 2021-03-03 ENCOUNTER — Encounter (INDEPENDENT_AMBULATORY_CARE_PROVIDER_SITE_OTHER): Payer: Self-pay | Admitting: Family Medicine

## 2021-03-25 ENCOUNTER — Encounter (INDEPENDENT_AMBULATORY_CARE_PROVIDER_SITE_OTHER): Payer: Self-pay | Admitting: Family Medicine

## 2021-03-25 ENCOUNTER — Ambulatory Visit (INDEPENDENT_AMBULATORY_CARE_PROVIDER_SITE_OTHER): Payer: BLUE CROSS/BLUE SHIELD | Admitting: Family Medicine

## 2021-03-25 VITALS — BP 148/90 | HR 68 | Temp 97.2°F | Ht 70.0 in | Wt 290.2 lb

## 2021-03-25 DIAGNOSIS — Z23 Encounter for immunization: Secondary | ICD-10-CM

## 2021-03-25 DIAGNOSIS — E890 Postprocedural hypothyroidism: Secondary | ICD-10-CM

## 2021-03-25 DIAGNOSIS — E782 Mixed hyperlipidemia: Secondary | ICD-10-CM

## 2021-03-25 DIAGNOSIS — E119 Type 2 diabetes mellitus without complications: Secondary | ICD-10-CM

## 2021-03-25 MED ORDER — METFORMIN HCL 500 MG PO TABS
500.0000 mg | ORAL_TABLET | Freq: Two times a day (BID) | ORAL | 1 refills | Status: DC
Start: 2021-03-25 — End: 2023-07-10

## 2021-03-25 NOTE — Progress Notes (Signed)
Subjective:      Patient ID: Gabriella Dean is a 65 y.o. female.    Chief Complaint:  Chief Complaint   Patient presents with    Diabetes Follow-up       HPI:  The patient presents for follow up  Issues include:    1.  Type 2 Diabetes.  She's taking her metformin as prescribed.  She sometimes feels sweaty.  She notes that she doesn't always eat lunch.  She's due for an A1C.    2.  Hyperlipidemia.  She's due for a lipid panel.  She's currently not on a statin.    3.  Goiter.  She's S/P subtotal thyroidectomy in early September of this year.  She has a follow up with her surgeon later this month.  She hasn't had a TSH since her surgery.      She's noted that she has difficulty getting up from chairs, particularly from low chairs.  She saw PT in the past and was told that she had a weak gluteus maximus.    She'd like to get flu and COVID vaccines today.        Problem List:  Patient Active Problem List   Diagnosis    Type 2 diabetes mellitus without complication, without long-term current use of insulin    Multinodular goiter    Mixed hyperlipidemia    Eczema    Seasonal allergies    Morbid obesity    Body mass index 40.0-44.9, adult    Preop examination       Current Medications:  Current Outpatient Medications   Medication Sig Dispense Refill    APPLE CIDER VINEGAR PO Take by mouth every morning Takes 4 gummies at time      Ascorbic Acid (vitamin C) 250 MG tablet Take 500 mg by mouth every morning 2 250 mg in AM, total 500 mg      B Complex Vitamins (B COMPLEX VITAMIN PO) Take by mouth every morning      metFORMIN (GLUCOPHAGE) 500 MG tablet Take 500 mg by mouth 2 (two) times daily with meals      Vitamin D, Cholecalciferol, 50 MCG (2000 UT) Cap Take 4,000 IU by mouth every morning       No current facility-administered medications for this visit.       Allergies:  Allergies   Allergen Reactions    Ampicillin Nausea And Vomiting and Rash    Chlor-Trimeton [Chlorpheniramine] Nausea And Vomiting and Rash     Erythromycin Nausea And Vomiting and Rash    Penicillins Nausea And Vomiting and Rash    Phenergan [Promethazine] Nausea And Vomiting    Tetracycline Nausea And Vomiting and Rash       Past Medical History:  Past Medical History:   Diagnosis Date    Type 2 diabetes mellitus, controlled        Past Surgical History:  Past Surgical History:   Procedure Laterality Date    CYST REMOVAL Left     Preauricular Left Ear cyst removal    D & C  1976    bleeding    D & C  1988    miscarriage    THYROIDECTOMY Left 02/11/2021    Procedure: LEFT THYROIDECTOMY;  Surgeon: Krystal Clark, MD;  Location: [facility];  Service: ENT;  Laterality: Left;    TONSILLECTOMY AND ADENOIDECTOMY  1967    WISDOM TOOTH EXTRACTION  1971       Family History:  Family  History   Problem Relation Age of Onset    COPD Mother         Smoker    Hypertension Mother     Lung cancer Father     Cancer Father         tumor in chest, unknown type    Diabetes Father     Diabetes Sister     Diabetes Brother     Stroke Brother     Prostate cancer Brother     Diabetes Brother     Heart disease Brother     Stent Brother     Cerebral aneurysm Paternal Grandmother     Lung cancer Maternal Grandfather     Cancer Maternal Grandfather         unknown type    Coronary artery disease Maternal Grandmother        Social History:  Social History     Socioeconomic History    Marital status: Single   Tobacco Use    Smoking status: Never    Smokeless tobacco: Never   Vaping Use    Vaping Use: Never used   Substance and Sexual Activity    Alcohol use: Yes     Comment: socially    Drug use: Never        The following sections were reviewed this encounter by the provider:   Tobacco  Allergies  Meds  Problems  Med Hx  Surg Hx  Fam Hx         ROS:  Review of Systems   Constitutional:  Positive for diaphoresis. Negative for activity change and appetite change.   Musculoskeletal:  Negative for back pain.   Neurological:  Negative for dizziness.     Vitals:  BP 148/90    Pulse 68   Temp 97.2 F (36.2 C)   Ht 1.778 m (5\' 10" )   Wt 131.6 kg (290 lb 3.2 oz)   BMI 41.64 kg/m      Objective:     Physical Exam:  Physical Exam  Constitutional:       General: She is not in acute distress.     Appearance: Normal appearance.   Neck:      Vascular: No carotid bruit.   Cardiovascular:      Rate and Rhythm: Normal rate and regular rhythm.      Heart sounds: No murmur heard.  Pulmonary:      Effort: Pulmonary effort is normal.      Breath sounds: No wheezing, rhonchi or rales.   Musculoskeletal:      Cervical back: Normal range of motion.   Neurological:      General: No focal deficit present.      Mental Status: She is alert.   Psychiatric:         Mood and Affect: Mood normal.        Assessment:     1. Type 2 diabetes mellitus without complication, without long-term current use of insulin  - Hemoglobin A1C  - metFORMIN (GLUCOPHAGE) 500 MG tablet; Take 1 tablet (500 mg total) by mouth 2 (two) times daily with meals  Dispense: 180 tablet; Refill: 1    2. Mixed hyperlipidemia  - Lipid panel    3. Immunization due  - COVID-19 Bivalent Vaccine 12 Years And Up Booster ([employer])  - Flu vaccine HIGH-DOSE QUAD (PF) 65 yrs and older    4. History of subtotal thyroidectomy  - TSH with Reflex to Free T4  Plan:     Type 2 Diabetes.  Likely stable.  Check A1C.  May be having episodes of hypoglycemia with diaphoresis.  Discussed strategies, may need to change dosing of metformin.  Refilled at current dose for now.  Hyperlipidemia.  Likely stable.  Check lipids.  S/P subtotal thyroidectomy.  Check TSH  Flu and COVID vaccines given.  Recommend evaluation by sports medicine for difficulty arising from chair.    Doyle Askew, MD

## 2021-03-25 NOTE — Patient Instructions (Signed)
Your metformin was refilled.    Labs were ordered to check your thyroid, A1C and cholesterol.    You received a high dose flu shot for patients over 65 and the most recent COVID vaccine today.    I'd recommend that you follow up with one of our sports medicine providers regarding your difficulty getting out of the chair.    Please follow up in 6 months or sooner as needed.

## 2021-03-26 ENCOUNTER — Encounter (INDEPENDENT_AMBULATORY_CARE_PROVIDER_SITE_OTHER): Payer: Self-pay | Admitting: Family Medicine

## 2021-03-26 LAB — LIPID PANEL
Cholesterol / HDL Ratio: 3 ratio (ref 0.0–4.4)
Cholesterol: 282 mg/dL — ABNORMAL HIGH (ref 100–199)
HDL: 93 mg/dL (ref >39)
LDL Chol Calculated (NIH): 173 mg/dL — ABNORMAL HIGH (ref 0–99)
Triglycerides: 96 mg/dL (ref 0–149)
VLDL Calculated: 16 mg/dL (ref 5–40)

## 2021-03-26 LAB — HEMOGLOBIN A1C: Hemoglobin A1C: 6.1 % — ABNORMAL HIGH (ref 4.8–5.6)

## 2021-03-26 LAB — TSH WITH REFLEX TO FREE T4: TSH: 1.29 u[IU]/mL (ref 0.450–4.500)

## 2021-04-02 ENCOUNTER — Ambulatory Visit (INDEPENDENT_AMBULATORY_CARE_PROVIDER_SITE_OTHER): Payer: BLUE CROSS/BLUE SHIELD | Admitting: Student in an Organized Health Care Education/Training Program

## 2021-04-08 ENCOUNTER — Telehealth (INDEPENDENT_AMBULATORY_CARE_PROVIDER_SITE_OTHER): Payer: BLUE CROSS/BLUE SHIELD | Admitting: Family Medicine

## 2021-04-08 ENCOUNTER — Encounter (INDEPENDENT_AMBULATORY_CARE_PROVIDER_SITE_OTHER): Payer: Self-pay | Admitting: Family Medicine

## 2021-04-08 DIAGNOSIS — E782 Mixed hyperlipidemia: Secondary | ICD-10-CM

## 2021-04-08 NOTE — Patient Instructions (Signed)
I've attached some information on lifestyle changes to lower your cholesterol.  I'd also recommend the book "The 8 Week Cholesterol Cure".    I'd recommend incorporating whole grains such as Cheerios in your diet and limiting red meat and other foods high in cholesterol.  You can consider adding flaxseed to your morning yogurt.    Please follow up for a cholesterol check in 6 months.  If your cholesterol is still elevated at that point, we'll need to reconsider medication to lower your cholesterol.

## 2021-04-08 NOTE — Progress Notes (Signed)
[facility] [place] - [facility] [place] [facility]                       Date of Virtual Visit: 04/08/2021 9:49 AM        Patient ID: Gabriella Dean is a 65 y.o. female.  Attending Physician: Doyle Askew, MD       Telemedicine Eligibility:    State Location:  [x]  [place]  []  [place]  []  [place] []  [place]  []  Other:    Physical Location:  [x]  Home  []         []        []          []  Other:    Patient Identity Verification:  []  State Issued ID  []  Insurance Eligibility Check  [x]  Other: Patient known to provider.    Physical Address Verification: (for 911)  []  Yes  [x]  No    Personal identity shared with patient:  [x]  Yes  []  No    Education on nature of video visit shared with patient:  [x]  Yes  []  No    Emergency plan agreed upon with patient:  []  Yes  [x]  No    If the patient had not had this virtual visit, what would they have done?  []         []         []        []          []  Other:    Visit terminated since not appropriate for virtual care:  [x]  N/A  []  Reason:    Visit conducted via audio and video         Chief Complaint:    Chief Complaint   Patient presents with    Lab Work Follow-up               HPI:    The patient presents to discuss her recent lipid results.  Her total cholesterol and LDL were elevated, and her overall 10 year risk of MI or stroke was elevated at 13.2%  She is interested in lifestyle changes as a first step to lower her risk and would like some advice regarding this.  She notes that she eats a fair amount of cheese.  She doesn't eat eggs and she's allergic to shellfish.  She gets about 8,000 steps per day on average.            Problem List:    Patient Active Problem List   Diagnosis    Type 2 diabetes mellitus without complication, without long-term current use of insulin    Multinodular goiter    Mixed hyperlipidemia    Eczema    Seasonal allergies    Morbid obesity    Body mass index 40.0-44.9, adult    Preop examination              Current Meds:    Outpatient Medications Marked as Taking for the 04/08/21 encounter (Telemedicine Visit) with Doyle Askew, MD   Medication Sig Dispense Refill    APPLE CIDER VINEGAR PO Take by mouth every morning Takes 4 gummies at time      Ascorbic Acid (vitamin C) 250 MG tablet Take 500 mg by mouth every morning 2 250 mg in AM, total 500 mg      B Complex Vitamins (B COMPLEX VITAMIN PO) Take by mouth every morning      metFORMIN (GLUCOPHAGE)  500 MG tablet Take 1 tablet (500 mg total) by mouth 2 (two) times daily with meals 180 tablet 1    Vitamin D, Cholecalciferol, 50 MCG (2000 UT) Cap Take 4,000 IU by mouth every morning            Allergies:    Allergies   Allergen Reactions    Ampicillin Nausea And Vomiting and Rash    Chlor-Trimeton [Chlorpheniramine] Nausea And Vomiting and Rash    Erythromycin Nausea And Vomiting and Rash    Penicillins Nausea And Vomiting and Rash    Phenergan [Promethazine] Nausea And Vomiting    Tetracycline Nausea And Vomiting and Rash             Past Surgical History:    Past Surgical History:   Procedure Laterality Date    CYST REMOVAL Left     Preauricular Left Ear cyst removal    D & C  1976    bleeding    D & C  1988    miscarriage    THYROIDECTOMY Left 02/11/2021    Procedure: LEFT THYROIDECTOMY;  Surgeon: Krystal Clark, MD;  Location: [facility];  Service: ENT;  Laterality: Left;    TONSILLECTOMY AND ADENOIDECTOMY  1967    WISDOM TOOTH EXTRACTION  1971           Family History:    Family History   Problem Relation Age of Onset    COPD Mother         Smoker    Hypertension Mother     Lung cancer Father     Cancer Father         tumor in chest, unknown type    Diabetes Father     Diabetes Sister     Diabetes Brother     Stroke Brother     Prostate cancer Brother     Diabetes Brother     Heart disease Brother     Stent Brother     Cerebral aneurysm Paternal Grandmother     Lung cancer Maternal Grandfather     Cancer Maternal Grandfather         unknown type     Coronary artery disease Maternal Grandmother            Social History:    Social History     Tobacco Use    Smoking status: Never    Smokeless tobacco: Never   Vaping Use    Vaping Use: Never used   Substance Use Topics    Alcohol use: Yes     Comment: socially    Drug use: Never           The following sections were reviewed this encounter by the provider:   Tobacco   Allergies   Meds   Problems   Med Hx   Surg Hx   Fam Hx              Vital Signs:    There were no vitals taken for this visit.         ROS:    Review of Systems   Constitutional:  Negative for activity change, appetite change and unexpected weight change.   Cardiovascular:  Negative for chest pain.            Physical Exam:    Physical Exam   GENERAL APPEARANCE: alert, in no acute distress, pleasant, well nourished.   HEAD: normal appearance  EYES: no discharge  EARS:  normal hearing  NECK: appearance -supple  PSYCH: appropriate affect, appropriate mood, normal speech, normal attention        Assessment:    1. Mixed hyperlipidemia          Plan:      We discussed that statins were generally recommended at her level of 10 year risk.  We discussed that she could try lifestyle modification as a first step for about 6 months if she wished.  We discussed eating whole grains such as Cheerios and adding flaxseed to her morning yogurt.  We discussed increasing her steps to 10,000 per day.  We also discussed decreasing the use of cholesterol in her diet and using Benecol on foods.    A total of 27 minutes was spent in direct counseling.          Follow-up:    Return in about 6 months (around 10/07/2021) for follow up cholesterol.         Doyle Askew, MD

## 2021-04-09 ENCOUNTER — Encounter (INDEPENDENT_AMBULATORY_CARE_PROVIDER_SITE_OTHER): Payer: Self-pay

## 2021-04-12 NOTE — Progress Notes (Signed)
[facility]  [facility] - [facility] [place] [facility]            Date of Exam: 04/13/2021 3:15 PM      Patient ID: Barri Neidlinger is a 65 y.o. female.  Attending Physician: Bosie Helper, DO      Chief Complaint:   As per HPI.  Otherwise -   Chief Complaint   Patient presents with    Movement Difficulty     Difficult getting up if sitting down, worsening since 03/25/21, pt thinks it's due to flu and covid shot she got on 03/25/21, R hip and R knee are most painful, has tried aleve and gets better with this, legs hurt when in bed.            HPI:   HPI  Knee Weakness/Pain     Date / Duration of Injury (DOI): ~2 weeks ago   Past history of similar problem: R ITB injury 6 yrs ago   Onset: sudden or gradual  Mechanism of Injury (MOI) / Trauma: denies, but notes difficulty from sitting to standing after getting flu and covid booster shots 2 weeks ago    Location of pain (most prominent): b/l posterior knees   Timing of pain (general): intermittent   Character of pain: dull ache   Radiation of symptoms: denies   Pain severity 4 / 10   Catching/Locking/Instability: instability   Exacerbating Factors: prolonged sitting to standing   Alleviating Factors: aleve   Patient has tried: --   Associated symptoms: denies numbness/tingling   Motor weakness distal to site of injury: denies   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 Getting [place] on L knee for ?Bakers cyst in 2 days        ROS:   As per HPI.  Otherwise as below.  Review of Systems        Problem List:   Patient Active Problem List   Diagnosis    Type 2 diabetes mellitus without complication, without long-term current use of insulin    Multinodular goiter    Mixed hyperlipidemia    Eczema    Seasonal allergies    Morbid obesity    Body mass index 40.0-44.9, adult    Preop examination        Current Meds:   Outpatient Medications Marked as Taking for the 04/13/21 encounter  (Office Visit) with Bosie Helper, DO   Medication Sig Dispense Refill    APPLE CIDER VINEGAR PO Take by mouth every morning Takes 4 gummies at time      Ascorbic Acid (vitamin C) 250 MG tablet Take 500 mg by mouth every morning 2 250 mg in AM, total 500 mg      B Complex Vitamins (B COMPLEX VITAMIN PO) Take by mouth every morning      metFORMIN (GLUCOPHAGE) 500 MG tablet Take 1 tablet (500 mg total) by mouth 2 (two) times daily with meals 180 tablet 1    Vitamin D, Cholecalciferol, 50 MCG (2000 UT) Cap Take 4,000 IU by mouth every morning           Allergies:   Allergies   Allergen Reactions    Ampicillin Nausea And Vomiting and Rash    Chlor-Trimeton [Chlorpheniramine] Nausea And Vomiting and Rash    Erythromycin Nausea And Vomiting and Rash    Penicillins Nausea And Vomiting and  Rash    Phenergan [Promethazine] Nausea And Vomiting    Tetracycline Nausea And Vomiting and Rash        Past Surgical History:   Past Surgical History:   Procedure Laterality Date    CYST REMOVAL Left     Preauricular Left Ear cyst removal    D & C  1976    bleeding    D & C  1988    miscarriage    THYROIDECTOMY Left 02/11/2021    Procedure: LEFT THYROIDECTOMY;  Surgeon: Krystal Clark, MD;  Location: [facility];  Service: ENT;  Laterality: Left;    TONSILLECTOMY AND ADENOIDECTOMY  1967    WISDOM TOOTH EXTRACTION  1971          Family History:   Family History   Problem Relation Age of Onset    COPD Mother         Smoker    Hypertension Mother     Lung cancer Father     Cancer Father         tumor in chest, unknown type    Diabetes Father     Diabetes Sister     Diabetes Brother     Stroke Brother     Prostate cancer Brother     Diabetes Brother     Heart disease Brother     Stent Brother     Cerebral aneurysm Paternal Grandmother     Lung cancer Maternal Grandfather     Cancer Maternal Grandfather         unknown type    Coronary artery disease Maternal Grandmother           Social History:   Social History     Tobacco Use     Smoking status: Never    Smokeless tobacco: Never   Vaping Use    Vaping Use: Never used   Substance Use Topics    Alcohol use: Yes     Comment: socially    Drug use: Never          The following sections were reviewed this encounter by the provider:   Tobacco  Allergies  Meds  Problems  Med Hx  Surg Hx  Fam Hx              Vital Signs:   There were no vitals taken for this visit.          Physical Exam:   Physical Exam     Knee Exam  Inspection: No gross deformities seen. No muscular atrophy. No effusion present.  No erythema present.  No evidence of genu valgum/varum or recurvatum. Leg lengths grossly equal. Normal gait.  Palpation: No increased warmth around joint. No TTP at the quadriceps tendon, patella, patellar tendon, tibial tuberosity, tibial tubercle, medial or lateral joint lines, MCL/LCL, pes anserine bursa, fibular head, biceps femoris, semitendinosus, semimembranosus.   ROM: Flexion WNL (145), Extension WNL (-5). Pt able to squat with patellar tracking appropriately.  Strength: 5/5 Flexion, Extension  Special testing:   Tibial squeeze: negative  Patellar grind: negative  Patellar subluxation: negative  Lachman: negative ; End point: firm   Anterior Drawer: normal ; End point: firm   Valgus Stress for MCL: negative ; End point: firm   Varus Stress for LCL: negative ; End point: firm   Posterior Drawer: negative ; End point: firm   Sag sign: negative   McMurray: negative   Thessalys: negative    Distal Neurovascular Intact  Procedure(s):   As per Procedure Note.  Otherwise as below.  Procedures          Assessment:   1. Right hip pain  - XR Hip right 2-3 vw with pelvis  - meloxicam (Mobic) 15 MG tablet; Take 1 tablet (15 mg total) by mouth daily  Dispense: 30 tablet; Refill: 0    2. Chronic pain of both knees  - XR Knee Left 4+ Views  - XR Knee Right 4+ Views  - meloxicam (Mobic) 15 MG tablet; Take 1 tablet (15 mg total) by mouth daily  Dispense: 30 tablet; Refill: 0  - Ambulatory referral  to Physical Therapy         Plan:     Treatment options discussed with patient at length, including risks, benefits, alternatives, and the nature of any potential procedures for the problem.      -We will order x-ray imaging as above  -We will trial course of anti-inflammatory as above to be taken scheduled for 1 to 2 weeks  -PT referral given the patient  -Consideration for CSI down the line  -Other conservative measures as below    RICE Tx / Ice vs. Heat, massage (e.g. Thera-Cane), stretching  OTC NSAIDs prn  OTC Tylenol prn  Relative rest and activity modification  Rehab exercises - handouts and PT Rx given  Follow up in 3-6 weeks    30 minutes spent with the patient encounter exclusive of time spent on separately reportable procedures performed and > 50% of that time was spent on counseling patient about the nature of the problem(s), as well as management options including risks, benefits, alternatives, and the nature of any potential procedures for the problem.  More than 50 percent of total office visit time spent on counseling and coordination of care.          Follow-up:   No follow-ups on file.          Kabella Cassidy Zadie Cleverly, DO

## 2021-04-13 ENCOUNTER — Ambulatory Visit (INDEPENDENT_AMBULATORY_CARE_PROVIDER_SITE_OTHER): Payer: BLUE CROSS/BLUE SHIELD | Admitting: Student in an Organized Health Care Education/Training Program

## 2021-04-13 ENCOUNTER — Encounter (INDEPENDENT_AMBULATORY_CARE_PROVIDER_SITE_OTHER): Payer: Self-pay | Admitting: Student in an Organized Health Care Education/Training Program

## 2021-04-13 ENCOUNTER — Other Ambulatory Visit (INDEPENDENT_AMBULATORY_CARE_PROVIDER_SITE_OTHER): Payer: Self-pay | Admitting: Student in an Organized Health Care Education/Training Program

## 2021-04-13 DIAGNOSIS — M25551 Pain in right hip: Secondary | ICD-10-CM

## 2021-04-13 DIAGNOSIS — G8929 Other chronic pain: Secondary | ICD-10-CM

## 2021-04-13 DIAGNOSIS — M25561 Pain in right knee: Secondary | ICD-10-CM

## 2021-04-13 DIAGNOSIS — M25562 Pain in left knee: Secondary | ICD-10-CM

## 2021-04-13 MED ORDER — MELOXICAM 15 MG PO TABS
15.0000 mg | ORAL_TABLET | Freq: Every day | ORAL | 0 refills | Status: DC
Start: 2021-04-13 — End: 2021-05-10

## 2021-04-14 ENCOUNTER — Encounter (INDEPENDENT_AMBULATORY_CARE_PROVIDER_SITE_OTHER): Payer: Self-pay | Admitting: Student in an Organized Health Care Education/Training Program

## 2021-04-15 ENCOUNTER — Other Ambulatory Visit: Payer: Self-pay | Admitting: Family Medicine

## 2021-04-15 ENCOUNTER — Other Ambulatory Visit (INDEPENDENT_AMBULATORY_CARE_PROVIDER_SITE_OTHER): Payer: Self-pay | Admitting: Family Medicine

## 2021-05-10 ENCOUNTER — Other Ambulatory Visit (INDEPENDENT_AMBULATORY_CARE_PROVIDER_SITE_OTHER): Payer: Self-pay | Admitting: Student in an Organized Health Care Education/Training Program

## 2021-05-10 DIAGNOSIS — M25561 Pain in right knee: Secondary | ICD-10-CM

## 2021-05-10 DIAGNOSIS — M25551 Pain in right hip: Secondary | ICD-10-CM

## 2021-05-10 DIAGNOSIS — G8929 Other chronic pain: Secondary | ICD-10-CM

## 2021-05-14 ENCOUNTER — Encounter (INDEPENDENT_AMBULATORY_CARE_PROVIDER_SITE_OTHER): Payer: Self-pay | Admitting: Family Medicine

## 2021-06-19 ENCOUNTER — Telehealth: Payer: Self-pay | Admitting: Student in an Organized Health Care Education/Training Program

## 2021-06-19 ENCOUNTER — Telehealth: Payer: Self-pay | Admitting: Residents

## 2021-06-19 ENCOUNTER — Encounter (INDEPENDENT_AMBULATORY_CARE_PROVIDER_SITE_OTHER): Payer: Self-pay | Admitting: Student in an Organized Health Care Education/Training Program

## 2021-06-19 ENCOUNTER — Ambulatory Visit (INDEPENDENT_AMBULATORY_CARE_PROVIDER_SITE_OTHER): Payer: BLUE CROSS/BLUE SHIELD | Admitting: Student in an Organized Health Care Education/Training Program

## 2021-06-19 VITALS — BP 164/88 | HR 88 | Temp 98.8°F | Resp 18 | Ht 70.0 in | Wt 285.0 lb

## 2021-06-19 DIAGNOSIS — M62838 Other muscle spasm: Secondary | ICD-10-CM

## 2021-06-19 MED ORDER — METHOCARBAMOL 750 MG PO TABS
750.0000 mg | ORAL_TABLET | Freq: Four times a day (QID) | ORAL | 0 refills | Status: DC
Start: 2021-06-19 — End: 2023-07-06

## 2021-06-19 MED ORDER — METHYLPREDNISOLONE 4 MG PO TBPK
ORAL_TABLET | ORAL | 0 refills | Status: DC
Start: 2021-06-19 — End: 2023-07-06

## 2021-06-19 NOTE — Telephone Encounter (Signed)
Called clinic after hours due to R buttock pain. Has been doing PT over past month. Last Friday PT session involved increased recumbent bike use and other hip-focused exercise. She reports significant pain since that time not improving with homes stretches. She feels like the muscle seizes up and leg gives way. Having trouble getting from low position to high position. Would usually swing legs to left and stand from bed. When she does this, she feels like R cheek is pulling down, then "pulling" pain. Does not travel down leg like sciatic pain. Pull is R where dimple of cheek would be on R side.  She has been using lacrosse ball for direct massage. BMI 41.6 but no problem with mobility. Icing it, 3-4 min on lacrosse ball, but getting out of bed not doable. On ice overnight due to pain. Has 2 week vacation with flight starting on Thursday, seeking other options for relief before trip. Had IT band injury in 2016 with similar pain but not this bad. Tip-toe helps with pain, and pain worse with heel down. Leg tends to lie with foot pointed R but able to rotate to L. Once up and walking, okay. Has good mobility of leg otherwise. Tried piriformis stretch seated and lying down alleviates things temporarily but pain still continues. Meloxicam has not helped. Pt lives ~1hr from clinic. Based on patient's description, suspect muscle spasm, however pt does noes that she has had no improvement with usual home care. Recommended presentation to [facility] clinic, noting that improvement may be dependent on time but would be of benefit to examine to determine specific source of pain to provide recommendations.    Zandra Abts, MD  06/19/21 6:46 AM

## 2021-06-19 NOTE — Progress Notes (Signed)
[facility] FAMILY PRACTICE & SPORTS MEDICINE  [facility] WALK-IN CLINIC - AN [place] PARTNER            WALK-IN CLINIC ENCOUNTER NOTE       Date of Exam: 06/19/2021 12:28 PM      Patient ID: Gabriella Dean is a 66 y.o. female.  Attending Physician: Bosie Helper, DO      Chief Complaint:   As per HPI.  Otherwise -   Chief Complaint   Patient presents with    Muscle Pain     Physical therapy injury. Pain in right glute x1 week. Leaving for trip on Thursday.            HPI:   HPI    Patient is being seen in the  - [facility].    XR reviewed and showing   1.  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  Bilateral knees: Moderately severe tricompartmental knee arthrosis    Mobic-- felt like this has not helped much   PT - has been doing for ~1 month. Was making some progress   Since then has R buttock pain after increasing PT intensity. Overnight resident note reviewed as below:    Called clinic after hours due to R buttock pain. Has been doing PT over past month. Last Friday PT session involved increased recumbent bike use and other hip-focused exercise. She reports significant pain since that time not improving with homes stretches. She feels like the muscle seizes up and leg gives way. Having trouble getting from low position to high position. Would usually swing legs to left and stand from bed. When she does this, she feels like R cheek is pulling down, then "pulling" pain. Does not travel down leg like sciatic pain. Pull is R where dimple of cheek would be on R side.  She has been using lacrosse ball for direct massage. BMI 41.6 but no problem with mobility. Icing it, 3-4 min on lacrosse ball, but getting out of bed not doable. On ice overnight due to pain. Has 2 week vacation with flight starting on Thursday, seeking other options for relief before trip. Had IT band injury in 2016 with similar pain but not this bad. Tip-toe helps with pain, and pain  worse with heel down. Leg tends to lie with foot pointed R but able to rotate to L. Once up and walking, okay. Has good mobility of leg otherwise. Tried piriformis stretch seated and lying down alleviates things temporarily but pain still continues. Meloxicam has not helped. Pt lives ~1hr from clinic. Based on patient's description, suspect muscle spasm, however pt does noes that she has had no improvement with usual home care. Recommended presentation to [facility] clinic, noting that improvement may be dependent on time but would be of benefit to examine to determine specific source of pain to provide recommendations.    ------------------------------------------------------------------------------------------  COPIED FROM OV NOTES ON 04/13/21    Knee Weakness/Pain      Date / Duration of Injury (DOI): ~2 weeks ago   Past history of similar problem: R ITB injury 6 yrs ago   Onset: sudden or gradual  Mechanism of Injury (MOI) / Trauma: denies, but notes difficulty from sitting to standing after getting flu and covid booster shots 2 weeks ago    Location of pain (most prominent): b/l posterior knees   Timing of pain (general): intermittent   Character of pain: dull ache  Radiation of symptoms: denies   Pain severity 4 / 10   Catching/Locking/Instability: instability   Exacerbating Factors: prolonged sitting to standing   Alleviating Factors: aleve   Patient has tried: --   Associated symptoms: denies numbness/tingling   Motor weakness distal to site of injury: denies   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 Getting [place] on L knee for ?Bakers cyst in 2 days     Plan:  -We will order x-ray imaging as above  -We will trial course of anti-inflammatory as above to be taken scheduled for 1 to 2 weeks  -PT referral given the patient  -Consideration for CSI down the line       ROS:   As per HPI.  Otherwise as below.  Review of Systems        Problem List: Past Surgical  History:   Patient Active Problem List   Diagnosis    Type 2 diabetes mellitus without complication, without long-term current use of insulin    Multinodular goiter    Mixed hyperlipidemia    Eczema    Seasonal allergies    Morbid obesity    Body mass index 40.0-44.9, adult    Preop examination    Past Surgical History:   Procedure Laterality Date    CYST REMOVAL Left     Preauricular Left Ear cyst removal    D & C  1976    bleeding    D & C  1988    miscarriage    THYROIDECTOMY Left 02/11/2021    Procedure: LEFT THYROIDECTOMY;  Surgeon: Krystal Clark, MD;  Location: [facility];  Service: ENT;  Laterality: Left;    TONSILLECTOMY AND ADENOIDECTOMY  1967    WISDOM TOOTH EXTRACTION  1971        Current Meds: Allergies:   Outpatient Medications Marked as Taking for the 06/19/21 encounter (Office Visit) with Bosie Helper, DO   Medication Sig Dispense Refill    APPLE CIDER VINEGAR PO Take by mouth every morning Takes 4 gummies at time      Ascorbic Acid (vitamin C) 250 MG tablet Take 500 mg by mouth every morning 2 250 mg in AM, total 500 mg      B Complex Vitamins (B COMPLEX VITAMIN PO) Take by mouth every morning      metFORMIN (GLUCOPHAGE) 500 MG tablet Take 1 tablet (500 mg total) by mouth 2 (two) times daily with meals 180 tablet 1    Vitamin D, Cholecalciferol, 50 MCG (2000 UT) Cap Take 4,000 IU by mouth every morning      Allergies   Allergen Reactions    Ampicillin Nausea And Vomiting and Rash    Chlor-Trimeton [Chlorpheniramine] Nausea And Vomiting and Rash    Erythromycin Nausea And Vomiting and Rash    Penicillins Nausea And Vomiting and Rash    Phenergan [Promethazine] Nausea And Vomiting    Tetracycline Nausea And Vomiting and Rash        Family History: Social History:   Family History   Problem Relation Age of Onset    COPD Mother         Smoker    Hypertension Mother     Lung cancer Father     Cancer Father         tumor in chest, unknown type    Diabetes Father     Diabetes Sister  Diabetes  Brother     Stroke Brother     Prostate cancer Brother     Diabetes Brother     Heart disease Brother     Stent Brother     Cerebral aneurysm Paternal Grandmother     Lung cancer Maternal Grandfather     Cancer Maternal Grandfather         unknown type    Coronary artery disease Maternal Grandmother     Social History     Tobacco Use    Smoking status: Never    Smokeless tobacco: Never   Vaping Use    Vaping Use: Never used   Substance Use Topics    Alcohol use: Yes     Comment: socially    Drug use: Never          The following sections were reviewed this encounter by the provider:   Tobacco  Allergies  Meds  Problems  Med Hx  Surg Hx  Fam Hx            Vital Signs:   BP 164/88   Pulse 88   Temp 98.8 F (37.1 C)   Resp 18   Ht 1.778 m (5\' 10" )   Wt 129.3 kg (285 lb)   BMI 40.89 kg/m           Physical Exam:   Physical Exam     Knee Exam  Inspection: No gross deformities seen. No muscular atrophy. No effusion present.  No erythema present.  No evidence of genu valgum/varum or recurvatum. Leg lengths grossly equal. Normal gait.  Palpation: No increased warmth around joint. No TTP at the quadriceps tendon, patella, patellar tendon, tibial tuberosity, tibial tubercle, medial or lateral joint lines, MCL/LCL, pes anserine bursa, fibular head, biceps femoris, semitendinosus, semimembranosus.   ROM: Flexion WNL (145), Extension WNL (-5). Pt able to squat with patellar tracking appropriately.  Strength: 5/5 Flexion, Extension  Special testing:   Tibial squeeze: negative  Patellar grind: negative  Patellar subluxation: negative  Lachman: negative ; End point: firm   Anterior Drawer: normal ; End point: firm   Valgus Stress for MCL: negative ; End point: firm   Varus Stress for LCL: negative ; End point: firm   Posterior Drawer: negative ; End point: firm   Sag sign: negative   McMurray: negative   Thessalys: negative     Distal Neurovascular Intact           BILATERAL KNEES COMPLETE MINIMUM 4 VIEWS/RIGHT HIP  WITH OR WITHOUT PELVIS, 2 OR 3 VIEWS- 04/13/21  HISTORY: Right hip and bilateral knee pain  COMPARISON: None.     FINDINGS:  Right hip: There is mild to moderate bilateral hip arthrosis. Similar  arthrosis noted in the bilateral sacroiliac joints. There is moderate  spondylosis in the visualized lower lumbar spine, incompletely assessed. No  fracture deformities appreciated.     Bilateral knees: There is moderately severe tricompartmental arthrosis in  both knees. Findings marked by joint space narrowing, marginal  osteophytosis and small to moderate-sized bilateral joint effusions. No  obvious radiopaque joint bodies. No fracture deformities are seen.     IMPRESSION:   1.  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  Bilateral knees: Moderately severe tricompartmental knee arthrosis         Procedure(s):   As per Procedure Note.  Otherwise as below.  Procedures          Assessment:  1. Muscle spasm of right lower extremity  - methocarbamol (ROBAXIN) 750 MG tablet; Take 1 tablet (750 mg) by mouth 4 (four) times daily  Dispense: 30 tablet; Refill: 0  - methylPREDNISolone (MEDROL DOSEPAK) 4 MG tablet; follow package directions  Dispense: 21 tablet; Refill: 0           Plan:     Treatment options discussed with patient at length, including risks, benefits, alternatives, and the nature of any potential procedures for the problem.     -Most relaxer prescribed as above for her spasm/tightness.  It is likely that she progressed too fast in her PT.  Since patient is going on a 2-week cruise next week I have also provided her with a write in prescription for Medrol Dosepak with specific instructions on when/how to take if need be.  Discussed possibility of CSI vs TPI down the line.  She will let me know how she progresses.      Ice vs. Heat, massage (e.g. Thera-Cane), stretching   OTC NSAIDs prn  OTC Tylenol prn  Relative rest and activity modification  Rehab exercises - cont PT  Follow up in 3-6  weeks prn     The patient was also advised to re-engage with a primary care provider for preventative health maintenance and wellness examination.    30 minutes spent with the patient encounter exclusive of time spent on separately reportable procedures performed and > 50% of that time was spent on counseling patient about the nature of the problem(s), as well as management options including risks, benefits, alternatives, and the nature of any potential procedures for the problem.  More than 50 percent of total office visit time spent on counseling and coordination of care.          Follow-up:   No follow-ups on file.          Duaine Radin Zadie Cleverly, DO

## 2021-06-19 NOTE — Telephone Encounter (Signed)
Pt called to confirm if theres anything else she should be doing for glute spasm. She was in walk in clinic today and prescribed robaxin. Started it today. Has been using ball to roll on the muscle as well. I also recommended heat to the muscle and continue current treatments

## 2021-06-23 ENCOUNTER — Other Ambulatory Visit (INDEPENDENT_AMBULATORY_CARE_PROVIDER_SITE_OTHER): Payer: Self-pay | Admitting: Student in an Organized Health Care Education/Training Program

## 2021-06-23 ENCOUNTER — Telehealth (INDEPENDENT_AMBULATORY_CARE_PROVIDER_SITE_OTHER): Payer: Self-pay | Admitting: Student in an Organized Health Care Education/Training Program

## 2021-06-23 DIAGNOSIS — M62838 Other muscle spasm: Secondary | ICD-10-CM

## 2021-06-23 MED ORDER — METAXALONE 800 MG PO TABS
800.0000 mg | ORAL_TABLET | Freq: Three times a day (TID) | ORAL | 0 refills | Status: DC | PRN
Start: 2021-06-23 — End: 2023-07-06

## 2021-06-23 NOTE — Progress Notes (Signed)
Rx for skelaxin given.

## 2021-06-23 NOTE — Telephone Encounter (Signed)
Patient calling asking for the Skelaxin that was discussed for potential use at OV on 1/7.   She is doing all the exercise and measures as discussed but still getting severe spasms when getting out of bed etc.  Leaves for cruise in 2 days.

## 2021-07-29 ENCOUNTER — Other Ambulatory Visit (INDEPENDENT_AMBULATORY_CARE_PROVIDER_SITE_OTHER): Payer: Self-pay | Admitting: Otolaryngology

## 2021-09-07 ENCOUNTER — Encounter (INDEPENDENT_AMBULATORY_CARE_PROVIDER_SITE_OTHER): Payer: Self-pay | Admitting: Family Medicine

## 2021-10-04 ENCOUNTER — Encounter (INDEPENDENT_AMBULATORY_CARE_PROVIDER_SITE_OTHER): Payer: Self-pay | Admitting: Family Medicine

## 2021-10-22 ENCOUNTER — Encounter (INDEPENDENT_AMBULATORY_CARE_PROVIDER_SITE_OTHER): Payer: Self-pay | Admitting: Family Medicine

## 2022-01-16 ENCOUNTER — Other Ambulatory Visit (INDEPENDENT_AMBULATORY_CARE_PROVIDER_SITE_OTHER): Payer: Self-pay | Admitting: Family Medicine

## 2022-01-16 DIAGNOSIS — E119 Type 2 diabetes mellitus without complications: Secondary | ICD-10-CM

## 2022-02-17 ENCOUNTER — Telehealth (INDEPENDENT_AMBULATORY_CARE_PROVIDER_SITE_OTHER): Payer: Self-pay | Admitting: Family Medicine

## 2022-02-17 ENCOUNTER — Other Ambulatory Visit (INDEPENDENT_AMBULATORY_CARE_PROVIDER_SITE_OTHER): Payer: Self-pay | Admitting: Family Medicine

## 2022-02-17 DIAGNOSIS — Z1211 Encounter for screening for malignant neoplasm of colon: Secondary | ICD-10-CM

## 2022-02-17 NOTE — Telephone Encounter (Signed)
I've updated the referral  

## 2022-02-17 NOTE — Telephone Encounter (Signed)
Please update the GI referral for patient.    Thank you.

## 2022-02-17 NOTE — Telephone Encounter (Signed)
Dr. Omar Person - Is it ok to re order GI referral from 12/2020 that has expired? Please advise.

## 2022-02-23 ENCOUNTER — Encounter (FREE_STANDING_LABORATORY_FACILITY): Payer: BLUE CROSS/BLUE SHIELD

## 2022-02-23 ENCOUNTER — Ambulatory Visit: Payer: Self-pay

## 2022-02-23 DIAGNOSIS — R1310 Dysphagia, unspecified: Secondary | ICD-10-CM

## 2022-02-23 LAB — SURGICAL PATHOLOGY EXAM

## 2022-02-24 ENCOUNTER — Encounter (INDEPENDENT_AMBULATORY_CARE_PROVIDER_SITE_OTHER): Payer: Self-pay | Admitting: Family Medicine

## 2022-02-28 LAB — LAB USE ONLY - HISTORICAL SURGICAL PATHOLOGY

## 2022-05-11 ENCOUNTER — Encounter (INDEPENDENT_AMBULATORY_CARE_PROVIDER_SITE_OTHER): Payer: Self-pay | Admitting: Family Medicine

## 2022-10-06 ENCOUNTER — Encounter (INDEPENDENT_AMBULATORY_CARE_PROVIDER_SITE_OTHER): Payer: Self-pay | Admitting: Student in an Organized Health Care Education/Training Program

## 2022-10-11 ENCOUNTER — Other Ambulatory Visit: Payer: Self-pay

## 2022-12-26 ENCOUNTER — Encounter (INDEPENDENT_AMBULATORY_CARE_PROVIDER_SITE_OTHER): Payer: Self-pay | Admitting: Student in an Organized Health Care Education/Training Program

## 2023-03-07 IMAGING — CT CT LOW DOSE LUNG SCREENING
2 of 5 series · 15 of 36 positions shown, 18 images · non-contrast
Comparison: None

HISTORY/INDICATION:  Screening, current tobacco use, one pack per day for 50 years.
TECHNIQUE: Scans of the chest are performed in the transaxial plane, reconstructed at 2 mm increments, without IV contrast using a low-dose scanning protocol. Coronal and sagittal lung reconstructions are also obtained.  Siemens nodule finding program was used to assist pulmonary nodule detection. 

Dose reduction technique used: Automated exposure control and adjustment of the mA and/or kV according to patient size. CT count in previous 12-months:  0

[Series 4: lung · axial · 0.71mm/px · z∈[-1285,-1033]mm · 12 of 144 slices shown, 15 images]
[im 9/144  mediastinal]
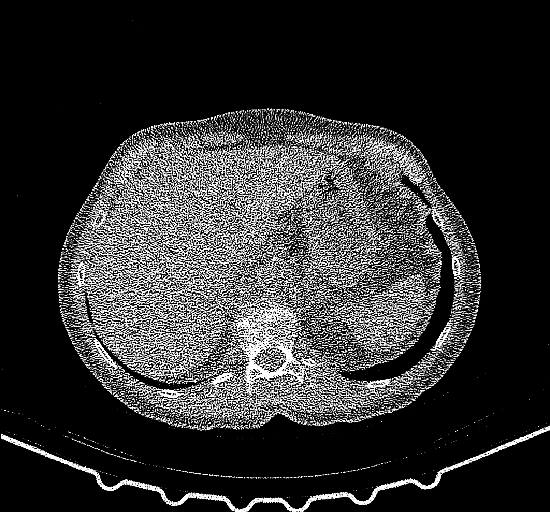
[im 9/144  lung]
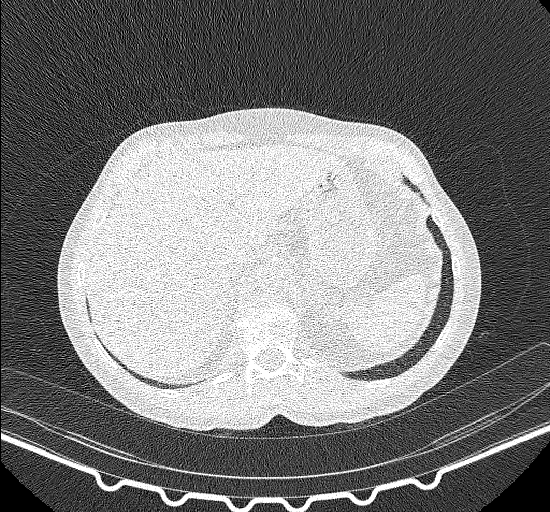
[im 26/144  lung]
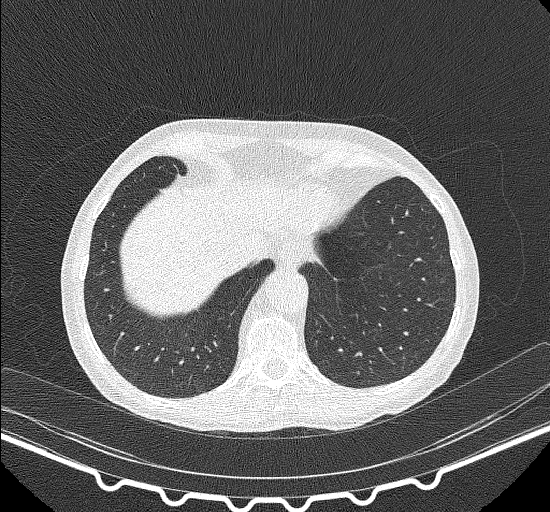
[im 34/144  lung]
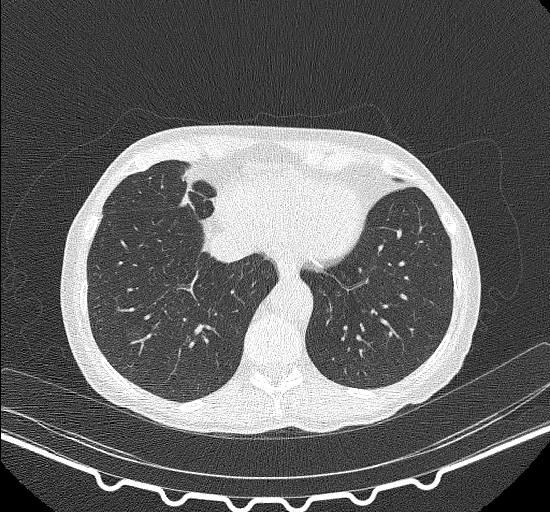
[im 43/144  lung]
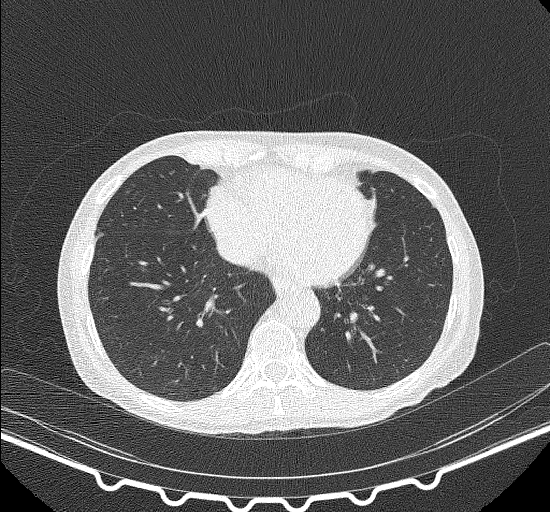
[im 59/144  mediastinal]
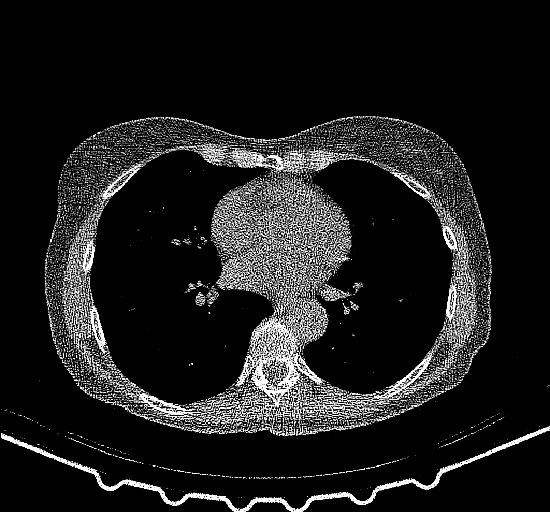
[im 59/144  lung]
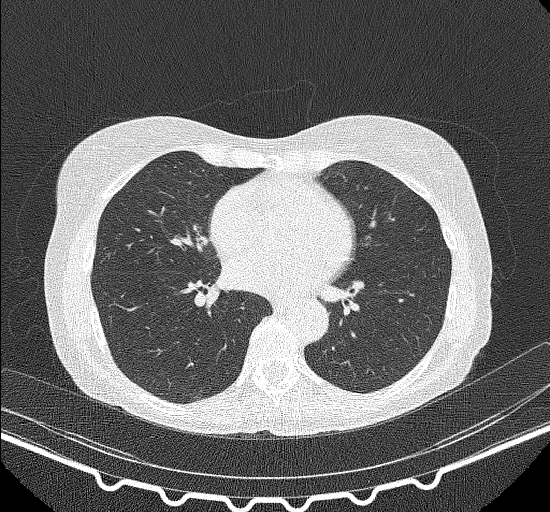
[im 68/144  lung]
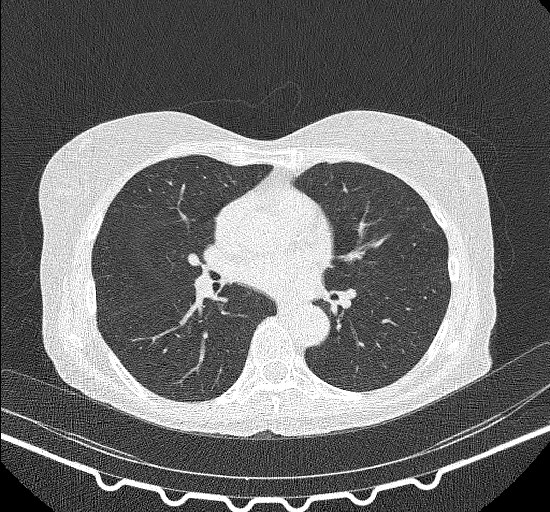
[im 76/144  lung]
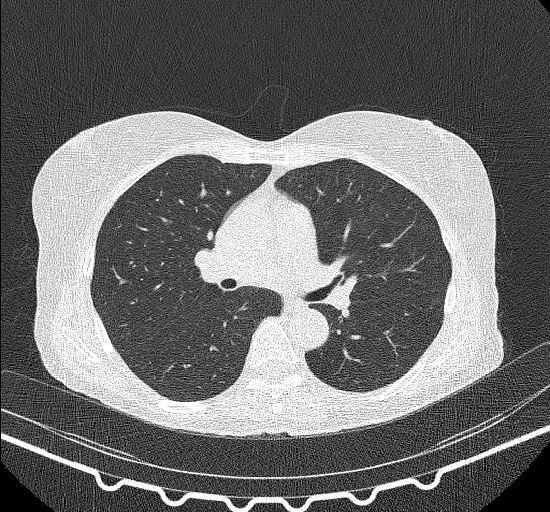
[im 93/144  lung]
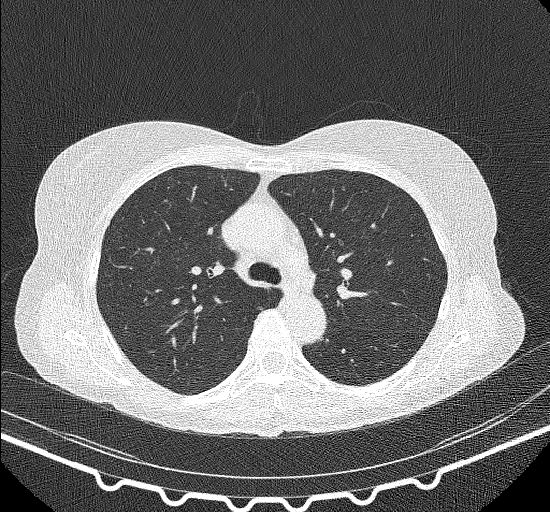
[im 101/144  mediastinal]
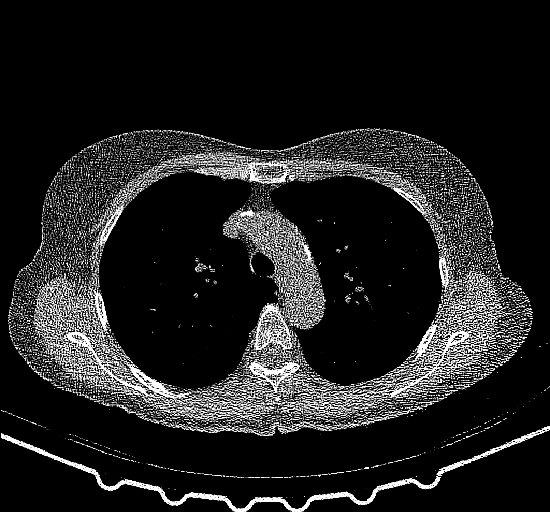
[im 101/144  lung]
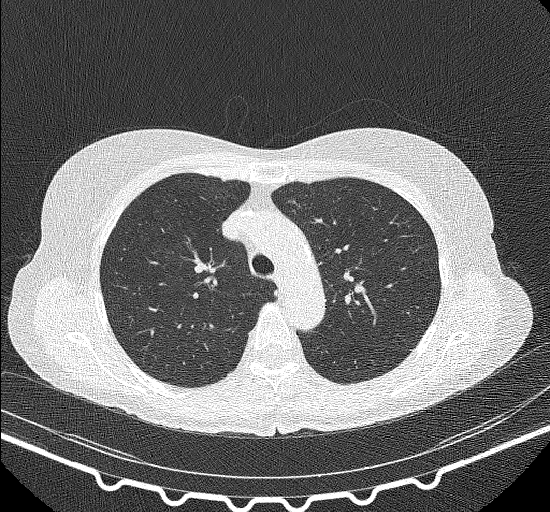
[im 110/144  lung]
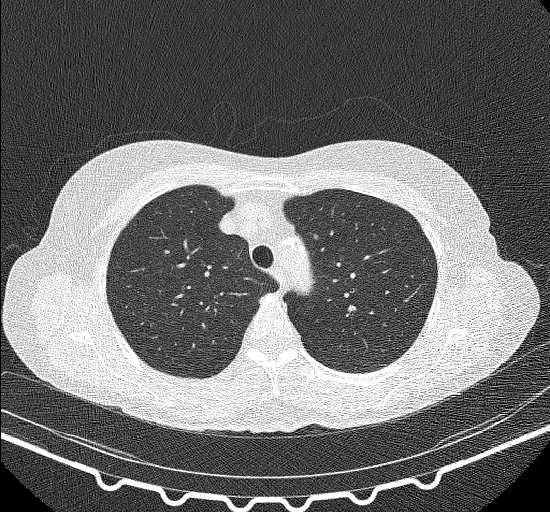
[im 127/144  lung]
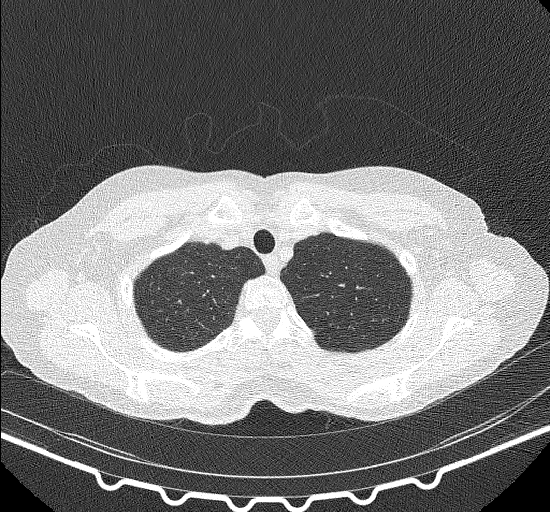
[im 135/144  lung]
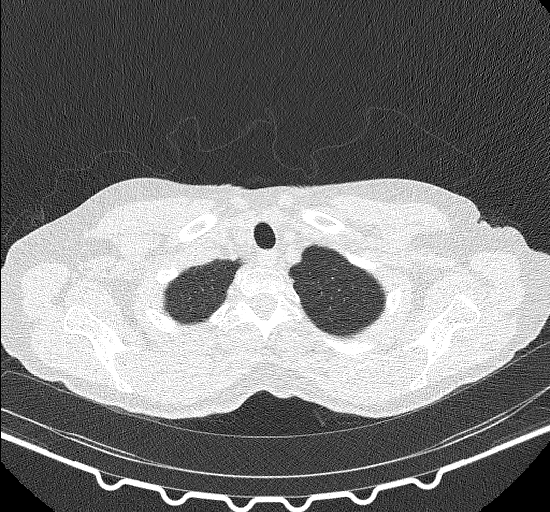

[Series 6: cor · coronal · 0.64mm/px · 3 of 92 slices shown]
[im 19/92  lung]
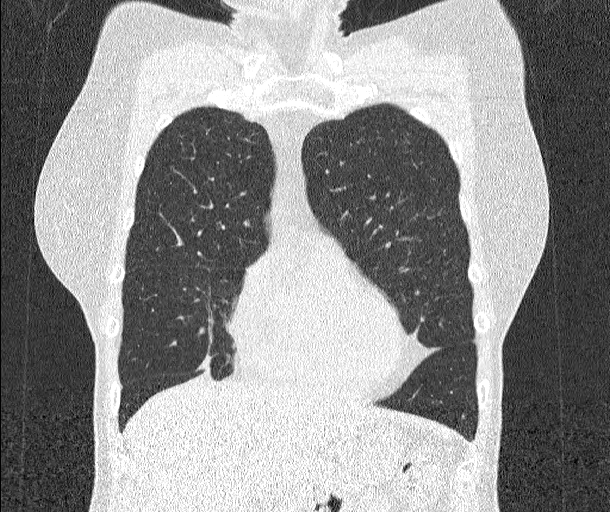
[im 37/92  lung]
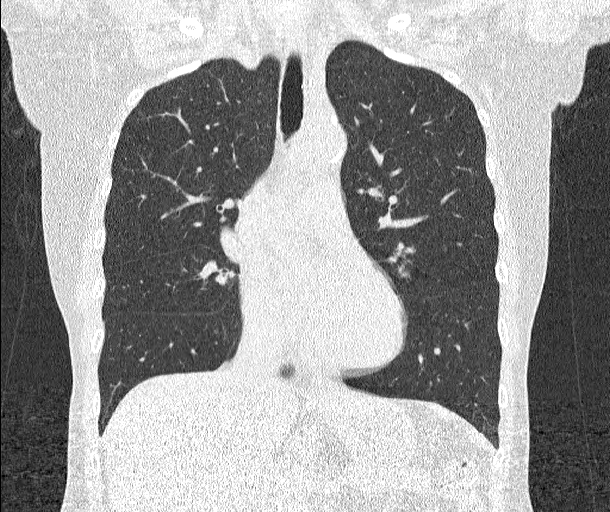
[im 55/92  lung]
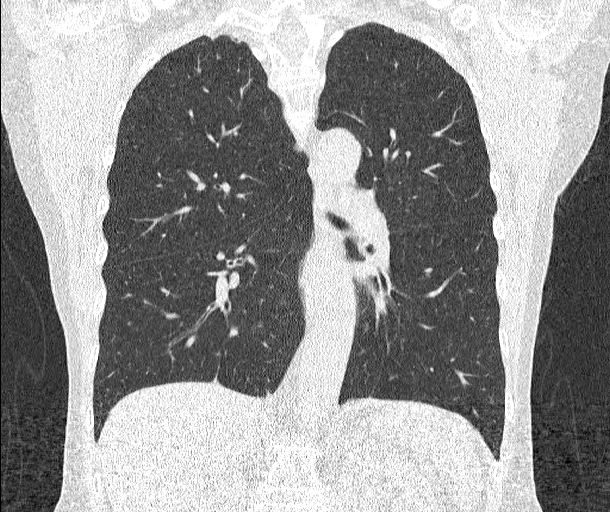

[15 of 36 positions shown; findings below may reference images not displayed]

FINDINGS: 2 mm right lung perifissural nodule ([DATE]), 3 mm right lung perifissural nodule ([DATE]), and 2 mm left lower lobe nodule ([DATE]). Calcified granulomas. No suspicious pulmonary nodule, mass or airspace consolidation. Mild to moderate emphysema. No pleural effusion or pneumothorax.

No cardiomegaly or pericardial effusion. Minimal coronary artery calcifications. No thoracic lymphadenopathy. Visualized abdomen demonstrates moderate calcifications of the upper abdominal aorta. No suspicious osseous lesion.
IMPRESSION: 1.  Lung Rads Category 2    

2. Small bilateral lung nodules measuring up to 3 mm and calcified granulomas, likely benign (less than 1% risk of malignancy).

RECOMMENDATION:  

Continue annual low-dose CT chest screening.

Total radiation dose to patient is CTDIvol 0.49 mGy and DLP 19.50 mGy-cm.

## 2023-03-13 ENCOUNTER — Encounter (INDEPENDENT_AMBULATORY_CARE_PROVIDER_SITE_OTHER): Payer: Self-pay | Admitting: Student in an Organized Health Care Education/Training Program

## 2023-07-06 NOTE — Progress Notes (Signed)
 [facility] FAMILY PRACTICE [place] - AN [place] PARTNER                       Date of Exam: 07/07/2023 10:51 AM        Patient ID: Gabriella Dean is a 68 y.o. female.  Attending Physician: Reena Fruits, DO        Chief Complaint:    Chief Complaint   Patient presents with    Diabetes Follow-up             HPI:    Patient is here today for chronic care    #T2DM  - Last A1c 6.1 in 03/2021  - Medication regimen: None currently. Was on Metformin  500mg  BID but ran out of medication and has not been on Metformin  for at least the past year  - Renal Protection: not on ACE/ARB   Microalbumin: due  Lab Results   Component Value Date    GLU 260 (H) 07/07/2023    LDL 209 (H) 07/07/2023    CREAT 0.7 07/07/2023   - CV: not on statin- counseled on statin use, she declines at this time   Lipid panel: due  Lab Results   Component Value Date    CHOL 297 (H) 07/07/2023    HDL 64 07/07/2023    LDL 209 (H) 07/07/2023    TRIG 118 07/07/2023     - Neuro: Last diabetic foot exam- self checks   - Ophtho: eye exam due  - Lifestyle Interventions:    Diet: reports a well balanced diet, fruits and vegetables. Tries to limit red meat. 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 Has also noted a blister like lesion around her anus that is somewhat painful after she wipes  - Denies any bleeding or discharge from the area  - No rashes or any skin changes     #Vaginal dryness  - Post menopausal, struggles with vaginal dryness  - New female sexual partner in the past several months- has discomfort with intercourse. Has been trying over the counter vaginal  moisturizer with some improvement    #3.5cm complex R ovarian cyst  - Pelvic ultrasound done in 08/2019 for post menopausal bleeding showed a 3.5cm complex R ovarian cyst for which follow up was recommended  -Denies any symptoms of abdominal pain or further abnormal uterine bleeding  The right ovary measures 3.9 x 2.9 x 3.7 cm and contains a complex cyst  that measures 3.3 x 1.8 x 3.5 cm. This cyst contains thin septations. The  left ovary measures 3.7 x 1.6 x 1.8 cm and contains a complex follicle that  measures 2.2 x 1.4 x 1.6 cm..     In addition patient has not been seen in our clinic since 2022 for routine care and is overdue for annual wellness exam.  She is due for the following health care maintenance gaps:  -Routine blood work  -Will likely need to be re-started on Metformin  for treatment of T2DM (pending A1c)  -Immunizations: Tdap, pneumococcal, shingles, flu  and COVID- declines today  -Breast cancer screening: up to date, mammogram done in 09/2022  -Cervical cancer screening: N/A. Last pap smear in 08/2019 normal, no history of abnormal pap smears   -Colon cancer screening: up to date 07/2021, reportedly normal, repeat in 10 years  -Occupation: research scientist (medical), desk job            Problem List:    Problem List[1]          Current Meds:    Medications Taking[2]       Allergies:    Allergies[3]          Past Surgical History:    Past Surgical History[4]        Family History:    Family History[5]        Social History:    Social History[6]        The following sections were reviewed this encounter by the provider:            Vital Signs:    BP 147/83   Pulse (!) 116   Temp 98.8 F (37.1 C)   Resp 16   Wt 134.7 kg (297 lb)   BMI 42.62 kg/m          ROS:    ROS as noted in HPI          Physical Exam:    Physical Exam  Vitals and nursing note reviewed.   Constitutional:       Appearance: Normal appearance.   HENT:      Head: Normocephalic and atraumatic.      Nose: Nose normal.      Mouth/Throat:      Mouth:  Mucous membranes are moist.   Eyes:      Extraocular Movements: Extraocular movements intact.      Conjunctiva/sclera: Conjunctivae normal.      Pupils: Pupils are equal, round, and reactive to light.   Cardiovascular:      Rate and Rhythm: Normal rate and regular rhythm.      Pulses: Normal pulses.   Pulmonary:      Effort: Pulmonary effort is normal.      Breath sounds: Normal breath sounds.   Genitourinary:     Comments: Atrophic vaginitis. Small 0.5cm lesion of mons pubis area that appears to be folliculitis/ simple cyst. Small external hemorrhoid at 6 o clock position that is not actively bleeding or thrombosed   Musculoskeletal:         General: Normal range of motion.      Cervical back: Normal range of motion.   Skin:     General: Skin is warm and dry.   Neurological:      General: No focal deficit present.      Mental Status: She is alert and oriented to person, place, and time. Mental status is at baseline.   Psychiatric:         Mood and Affect: Mood normal.              Assessment/Plan:    1. Type 2 diabetes mellitus without complication, without long-term current use of insulin  - Lipid Panel; Future  - Hemoglobin A1C; Future  - Comprehensive Metabolic Panel; Future  - Urine Microalbumin, Random; Future  - metFORMIN  (GLUCOPHAGE -XR) 500 MG 24 hr tablet; Take 1 tablet (500 mg) by mouth 2 (two) times daily with meals If tolerating well without any side effects in one week, increase dose to 2 tablets (1000 mg) two times  a day  Dispense: 90 tablet; Refill: 3    Patient with history of T2DM was previously well controlled with Metformin  500mg  BID however due to lapses in care has not been seen in clinic for the past 2 years and has been on Metformin  for the past year. Will recheck A1c and other routine labs as mentioned above. Will likely need to restart at least Metformin  pending A1c.    Recommend close and frequent follow up for DM monitoring. She is also due for routine eye exam, foot exam, urine  microalbumin and vaccinations as mentioned below.     2. Mixed hyperlipidemia  - Lipid Panel; Future  Given history of T2DM and HLD, counseled patient that she should be on statin for ACSVD risk reduction however she declines at this time.     3. Immunization due  Due for flu, covid, shingles, TDAP, pneumococcal and hepatitis B (given history of DM)- declines vaccinations today.    4. Thyroid  goiter  - Thyroid  Stimulating Hormone (TSH) with Reflex to Free T4; Future  History of left sided thyroidectomy in 02/2021 due to goiter. Will check TSH    5. Right ovarian cyst  - US  Pelvis with Transvaginal; Future  Repeat pelvis US  to monitor 3.5cm complex R ovarian cyst as recommended from pelvic ultrasound done in 08/2019. She denies any abdominal pain or any further AUB.    6. Genitourinary syndrome of menopause  - estradiol  (ESTRACE ) 0.1 MG/GM vaginal cream; Apply 1g intravaginally once daily for 1 week followed by 1g intravaginally 3 times a week  Dispense: 42.5 g; Refill: 3  Found to have atrophic vaginitis on exam and endorses pain with intercourse. Will start patient on vaginal Estrace  cream.     Did not discuss today- offer patient routine STI screening at next follow up appointment.    7. External hemorrhoid  Small non thrombosed external hemorrhoid on exam. Sitz baths and preparation H as needed for discomfort. Counseled on the importance of high fiber diet and bowel regimen as needed for constipation.    8. Tachycardia  Elevated HR in office today, asymptomatic. Will check TSH as mentioned above. Will need monitoring in office and at home.          Follow-up:    Return in about 4 weeks (around 08/04/2023) for chronic care fu/ medicare AWV.         Reena Fruits, DO                     [1]   Patient Active Problem List  Diagnosis    Type 2 diabetes mellitus without complication, without long-term current use of insulin    Multinodular goiter    Mixed hyperlipidemia    Eczema    Seasonal allergies    Morbid obesity     Body mass index 40.0-44.9, adult   [2]   Outpatient Medications Marked as Taking for the 07/07/23 encounter (Office Visit) with Vendela Troung, DO   Medication Sig Dispense Refill    Ascorbic Acid (vitamin C) 250 MG tablet Take 2 tablets (500 mg) by mouth every morning 2 250 mg in AM, total 500 mg      B Complex Vitamins (B COMPLEX VITAMIN PO) Take by mouth every morning      Omega-3 Fatty Acids (FISH OIL PO) 100 mg      Vitamin D, Cholecalciferol, 50 MCG (2000 UT) Cap Take 4,000 IU by mouth every morning     [3]  Allergies  Allergen Reactions    Ampicillin Nausea And Vomiting and Rash    Chlor-Trimeton [Chlorpheniramine] Nausea And Vomiting and Rash    Erythromycin Nausea And Vomiting and Rash    Penicillins Nausea And Vomiting and Rash    Phenergan [Promethazine] Nausea And Vomiting    Tetracycline Nausea And Vomiting and Rash   [4]   Past Surgical History:  Procedure Laterality Date    CYST REMOVAL Left     Preauricular Left Ear cyst removal    D & C  1976    bleeding    D & C  1988    miscarriage    THYROIDECTOMY, TOTAL Left 02/11/2021    Procedure: LEFT THYROIDECTOMY;  Surgeon: Victorine Rosina MATSU, MD;  Location: [facility];  Service: ENT;  Laterality: Left;    TONSILLECTOMY AND ADENOIDECTOMY  1967    WISDOM TOOTH EXTRACTION  1971   [5]   Family History  Problem Relation Name Age of Onset    COPD Mother          Smoker    Hypertension Mother      Lung cancer Father      Cancer Father          tumor in chest, unknown type    Diabetes Father      Diabetes Sister      Diabetes Brother      Stroke Brother      Prostate cancer Brother      Diabetes Brother      Heart disease Brother      Stent Brother      Cerebral aneurysm Paternal Grandmother      Lung cancer Maternal Grandfather      Cancer Maternal Grandfather          unknown type    Coronary artery disease Maternal Grandmother     [6]   Social History  Tobacco Use    Smoking status: Never    Smokeless tobacco: Never   Vaping Use    Vaping status: Never Used    Substance Use Topics    Alcohol use: Yes     Comment: socially    Drug use: Never

## 2023-07-07 ENCOUNTER — Ambulatory Visit (FREE_STANDING_LABORATORY_FACILITY): Payer: Medicare Other

## 2023-07-07 VITALS — BP 147/83 | HR 116 | Temp 98.8°F | Resp 16 | Wt 297.0 lb

## 2023-07-07 DIAGNOSIS — Z23 Encounter for immunization: Secondary | ICD-10-CM

## 2023-07-07 DIAGNOSIS — E782 Mixed hyperlipidemia: Secondary | ICD-10-CM

## 2023-07-07 DIAGNOSIS — R000 Tachycardia, unspecified: Secondary | ICD-10-CM

## 2023-07-07 DIAGNOSIS — E119 Type 2 diabetes mellitus without complications: Secondary | ICD-10-CM

## 2023-07-07 DIAGNOSIS — N958 Other specified menopausal and perimenopausal disorders: Secondary | ICD-10-CM

## 2023-07-07 DIAGNOSIS — N83201 Unspecified ovarian cyst, right side: Secondary | ICD-10-CM

## 2023-07-07 DIAGNOSIS — E049 Nontoxic goiter, unspecified: Secondary | ICD-10-CM

## 2023-07-07 DIAGNOSIS — K644 Residual hemorrhoidal skin tags: Secondary | ICD-10-CM

## 2023-07-07 LAB — THYROID STIMULATING HORMONE (TSH) WITH REFLEX TO FREE T4: TSH: 0.7 u[IU]/mL (ref 0.35–4.94)

## 2023-07-07 LAB — LIPID PANEL
Cholesterol / HDL Ratio: 4.6 {index}
Cholesterol: 297 mg/dL — ABNORMAL HIGH (ref <=199)
HDL: 64 mg/dL (ref >=40)
LDL Calculated: 209 mg/dL — ABNORMAL HIGH (ref 0–99)
Triglycerides: 118 mg/dL (ref 34–149)
VLDL Calculated: 24 mg/dL (ref 10–40)

## 2023-07-07 LAB — COMPREHENSIVE METABOLIC PANEL
ALT: 18 U/L (ref <=55)
AST (SGOT): 20 U/L (ref <=41)
Albumin/Globulin Ratio: 0.9 (ref 0.9–2.2)
Albumin: 3.7 g/dL (ref 3.5–5.0)
Alkaline Phosphatase: 111 U/L (ref 37–117)
Anion Gap: 9 (ref 5.0–15.0)
BUN: 12 mg/dL (ref 7–21)
Bilirubin, Total: 0.7 mg/dL (ref 0.2–1.2)
CO2: 27 meq/L (ref 17–29)
Calcium: 9.5 mg/dL (ref 8.5–10.5)
Chloride: 101 meq/L (ref 99–111)
Creatinine: 0.7 mg/dL (ref 0.4–1.0)
GFR: >60 mL/min/{1.73_m2} (ref >=60.0)
Globulin: 4.1 g/dL — ABNORMAL HIGH (ref 2.0–3.6)
Glucose: 260 mg/dL — ABNORMAL HIGH (ref 70–100)
Hemolysis Index: 11 {index}
Potassium: 4.7 meq/L (ref 3.5–5.3)
Protein, Total: 7.8 g/dL (ref 6.0–8.3)
Sodium: 137 meq/L (ref 135–145)

## 2023-07-07 LAB — URINE MICROALBUMIN, RANDOM
Urine Creatinine: 108 mg/dL
Urine Microalbumin/Creatinine Ratio: 49 ug/mg — ABNORMAL HIGH (ref <30)
Urine Microalbumin: 53 ug/mL — ABNORMAL HIGH (ref 0.0–30.0)

## 2023-07-07 LAB — HEMOGLOBIN A1C
Average Estimated Glucose: 251.8 mg/dL
Hemoglobin A1C: 10.4 % — ABNORMAL HIGH (ref 4.6–5.6)

## 2023-07-07 MED ORDER — ESTRADIOL 0.1 MG/GM VA CREA
TOPICAL_CREAM | VAGINAL | 3 refills | Status: AC
Start: 2023-07-07 — End: ?

## 2023-07-10 ENCOUNTER — Telehealth (INDEPENDENT_AMBULATORY_CARE_PROVIDER_SITE_OTHER): Payer: Self-pay

## 2023-07-10 MED ORDER — METFORMIN HCL ER 500 MG PO TB24
500.0000 mg | ORAL_TABLET | Freq: Two times a day (BID) | ORAL | 3 refills | Status: DC
Start: 2023-07-10 — End: 2023-10-09

## 2023-07-10 NOTE — Telephone Encounter (Signed)
 Called patient to discuss lab results- no answer x2 LVMTCB. Will sent message to blue team to call patient at later time.    She has a follow up appointment with me scheduled on 08/15/23 but I recommend she have another appointment before then to discuss labs results as mentioned below, medication additions and to catch up on care gaps.     Labs significant for A1c 10.4. She will need to be re-started on Metformin  (taper up to highest dose 1000mg  BID) and likely second agent.  Urine microalbumin/Cr is also elevated and I recommend that she be started on ACEI/ARB. Lipid panel shows elevated total cholesterol and LDL and she should be started on a statin (titrating up to high intensity dosing). In addition she is also due for routine vaccinations including TDAP, pneumococcal, hepatitis B, shingles, covid and flu.

## 2023-07-11 NOTE — Progress Notes (Signed)
 Patient seen and discussed with April Holding, DO  concurrently with the patient's visit.  Chart reviewed to include allergies, problems, current medications and family, social and past medical and surgical histories.  I reviewed the history, review of systems and physical exam as outlined in April Holding, DO's note and I agree with the findings as outlined in their note.  We discussed the assessment and plan for the encounter in detail and I concur with the assessment and plan as per April Holding, DO .

## 2023-07-12 ENCOUNTER — Encounter (INDEPENDENT_AMBULATORY_CARE_PROVIDER_SITE_OTHER): Payer: Self-pay

## 2023-07-12 NOTE — Progress Notes (Signed)
 Please attempt to reach patient to make sooner appt to discuss new diagnosis of diabetes.  See portal message sent today for details.

## 2023-07-14 NOTE — Progress Notes (Signed)
 Appointment scheduled on 07/25/23 with Dr. Abundio Hoit. -Gabriella Dean 07/14/23

## 2023-07-24 NOTE — Progress Notes (Signed)
 [facility] [place] [facility] [place] [facility]                       Date of Exam: 07/25/2023 9:19 AM        Patient ID: Gabriella Dean is a 68 y.o. female.  Physician: Arnaldo GORMAN Dross, MD        Chief Complaint:    Chief Complaint   Patient presents with    Diabetes Follow-up    Lab Review             HPI:    HPI    68 y.o. female presents for lab review.    Labs 07/07/23:  Lipid panel TC 297, TG 118, HDL 64, LDL 209  HbA1c 10.4%  CMP with BG 260, otherwise wnl  Urine microalbumin elevated  TSH wnl    Since results came back she has resumed metformin . Currently taking metformin  500 mg BID. Has side effect of stomach feeling upset, somewhat better when she takes it with food. Hasn't been able to tolerate titrating up dose. Does not monitor BG at home.    BP 148/86 in office today.    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 Rock Mire)    Denies polyuria, polydipsia, numbness/tingling.            Problem List:    Problem List[1]          Current Meds:    Medications Taking[2]       Allergies:    Allergies[3]          Past Surgical History:    Past Surgical History[4]        Family History:    Family History[5]        Social History:    Social History[6]        The following sections were reviewed this encounter by the provider:            Vital Signs:    BP 148/86 (BP Site: Right arm, Patient Position: Sitting)   Pulse 96   Temp 97.3 F (36.3 C) (Tympanic)   Resp 20   Wt 135.6 kg (299 lb)   BMI 42.90 kg/m          ROS:    Denies fevers/chills, CP, SOB, cough, n/v, diarrhea, constipation, urinary symptoms.          Physical Exam:    Gen: well-appearing, NAD  CV: RRR, no m/r/g  Resp: Lungs CTAB, no wheezing, rales, or  rhonchi  Ext: no edema        Assessment:    1. Type 2 diabetes mellitus with diabetic microalbuminuria, without long-term current use of insulin  - Blood Glucose Monitor, generic, Kit; Use as Directed  Dispense: 1 kit; Refill: 1    2. Mixed hyperlipidemia  - atorvastatin  (LIPITOR) 20 MG tablet; Take 1 tablet (20 mg) by mouth daily  Dispense: 90 tablet; Refill: 3    3. Essential hypertension  - lisinopril  (ZESTRIL ) 5 MG tablet; Take 1 tablet (5 mg) by mouth daily  Dispense: 90 tablet; Refill: 3            Plan:  Patient Instructions   Seen today for lab follow up.    Discussed HbA1c was elevated at 10.4%. Discussed recommendation would have been to start insulin for acute improvement in blood glucose control but given you have been doing OK on metformin  and your preference is to avoid insulin if possible, will continue metformin  for now. Recommend starting home fasting BG monitoring and bringing recordings to next visit. May need to consider insulin versus adding second oral agent pending improvement in BG levels.    Discussed elevated BP in office today and at previous visit consistent with diagnosis of hypertension. Recommend starting lisinopril  for BP control as well as kidney protection in setting of microalbuminuria with diabetes. Start home BP monitoring as well and bring recordings to next visit.    Reviewed lipid panel. Will start atorvastatin  20 mg daily for hyperlipidemia as well as in setting of diabetes.    Continue to work on lifestyle interventions -- trying to increase exercise (aim for 30 min, 5 days per week) as well as adjusting portion sizes with meals.    Follow up as scheduled with PCP in March.          Follow-up:    Return in about 3 weeks (around 08/15/2023) for Chronic care f/u.         Arnaldo GORMAN Dross, MD                     [1]   Patient Active Problem List  Diagnosis    Type 2 diabetes mellitus without complication, without long-term current use of insulin    Multinodular goiter     Mixed hyperlipidemia    Eczema    Seasonal allergies    Morbid obesity    Body mass index 40.0-44.9, adult   [2]   Outpatient Medications Marked as Taking for the 07/25/23 encounter (Office Visit) with Dross Arnaldo GORMAN, MD   Medication Sig Dispense Refill    Ascorbic Acid (vitamin C) 250 MG tablet Take 2 tablets (500 mg) by mouth every morning 2 250 mg in AM, total 500 mg      B Complex Vitamins (B COMPLEX VITAMIN PO) Take by mouth every morning      estradiol  (ESTRACE ) 0.1 MG/GM vaginal cream Apply 1g intravaginally once daily for 1 week followed by 1g intravaginally 3 times a week 42.5 g 3    metFORMIN  (GLUCOPHAGE -XR) 500 MG 24 hr tablet Take 1 tablet (500 mg) by mouth 2 (two) times daily with meals If tolerating well without any side effects in one week, increase dose to 2 tablets (1000 mg) two times a day 90 tablet 3    Omega-3 Fatty Acids (FISH OIL PO) 1,000 mg      Vitamin D, Cholecalciferol, 50 MCG (2000 UT) Cap Take 4,000 IU by mouth every morning     [3]   Allergies  Allergen Reactions    Ampicillin Nausea And Vomiting and Rash    Chlor-Trimeton [Chlorpheniramine] Nausea And Vomiting and Rash    Erythromycin Nausea And Vomiting and Rash    Penicillins Nausea And Vomiting and Rash    Phenergan [Promethazine] Nausea And Vomiting    Tetracycline Nausea And Vomiting and Rash   [4]   Past Surgical History:  Procedure Laterality Date    CYST REMOVAL Left     Preauricular Left Ear cyst removal    D & C  1976    bleeding    D & C  1988  miscarriage    THYROIDECTOMY, TOTAL Left 02/11/2021    Procedure: LEFT THYROIDECTOMY;  Surgeon: Victorine Rosina MATSU, MD;  Location: [facility];  Service: ENT;  Laterality: Left;    TONSILLECTOMY AND ADENOIDECTOMY  1967    WISDOM TOOTH EXTRACTION  1971   [5]   Family History  Problem Relation Name Age of Onset    COPD Mother          Smoker    Hypertension Mother      Lung cancer Father      Cancer Father          tumor in chest, unknown type    Diabetes Father      Diabetes  Sister      Diabetes Brother      Stroke Brother      Prostate cancer Brother      Diabetes Brother      Heart disease Brother      Stent Brother      Cerebral aneurysm Paternal Grandmother      Lung cancer Maternal Grandfather      Cancer Maternal Grandfather          unknown type    Coronary artery disease Maternal Grandmother     [6]   Social History  Tobacco Use    Smoking status: Never    Smokeless tobacco: Never   Vaping Use    Vaping status: Never Used   Substance Use Topics    Alcohol use: Yes     Comment: socially    Drug use: Never

## 2023-07-25 ENCOUNTER — Ambulatory Visit (INDEPENDENT_AMBULATORY_CARE_PROVIDER_SITE_OTHER): Payer: Medicare Other | Admitting: Family Medicine

## 2023-07-25 VITALS — BP 148/86 | HR 96 | Temp 97.3°F | Resp 20 | Wt 299.0 lb

## 2023-07-25 DIAGNOSIS — I10 Essential (primary) hypertension: Secondary | ICD-10-CM

## 2023-07-25 DIAGNOSIS — R809 Proteinuria, unspecified: Secondary | ICD-10-CM

## 2023-07-25 DIAGNOSIS — E782 Mixed hyperlipidemia: Secondary | ICD-10-CM

## 2023-07-25 DIAGNOSIS — E1129 Type 2 diabetes mellitus with other diabetic kidney complication: Secondary | ICD-10-CM

## 2023-07-25 MED ORDER — LISINOPRIL 5 MG PO TABS
5.0000 mg | ORAL_TABLET | Freq: Every day | ORAL | 3 refills | Status: DC
Start: 2023-07-25 — End: 2023-08-15

## 2023-07-25 MED ORDER — ATORVASTATIN CALCIUM 20 MG PO TABS
20.0000 mg | ORAL_TABLET | Freq: Every day | ORAL | 3 refills | Status: DC
Start: 2023-07-25 — End: 2023-10-09

## 2023-07-25 MED ORDER — BLOOD GLUCOSE MONITOR WITH STRIPS (GENERIC)
PACK | 1 refills | Status: DC
Start: 2023-07-25 — End: 2023-10-09

## 2023-07-25 NOTE — Patient Instructions (Addendum)
 Seen today for lab follow up.    Discussed HbA1c was elevated at 10.4%. Discussed recommendation would have been to start insulin for acute improvement in blood glucose control but given you have been doing OK on metformin  and your preference is to avoid insulin if possible, will continue metformin  for now. Recommend starting home fasting BG monitoring and bringing recordings to next visit. May need to consider insulin versus adding second oral agent pending improvement in BG levels.    Discussed elevated BP in office today and at previous visit consistent with diagnosis of hypertension. Recommend starting lisinopril  for BP control as well as kidney protection in setting of microalbuminuria with diabetes. Start home BP monitoring as well and bring recordings to next visit.    Reviewed lipid panel. Will start atorvastatin  20 mg daily for hyperlipidemia as well as in setting of diabetes.    Continue to work on lifestyle interventions -- trying to increase exercise (aim for 30 min, 5 days per week) as well as adjusting portion sizes with meals.    Follow up as scheduled with PCP in March.

## 2023-07-26 ENCOUNTER — Telehealth (INDEPENDENT_AMBULATORY_CARE_PROVIDER_SITE_OTHER): Payer: Self-pay | Admitting: Family Medicine

## 2023-07-26 ENCOUNTER — Other Ambulatory Visit (INDEPENDENT_AMBULATORY_CARE_PROVIDER_SITE_OTHER): Payer: Self-pay | Admitting: Family Medicine

## 2023-07-26 DIAGNOSIS — E119 Type 2 diabetes mellitus without complications: Secondary | ICD-10-CM

## 2023-07-26 MED ORDER — GLUCOSE BLOOD VI STRP
ORAL_STRIP | 3 refills | Status: DC
Start: 2023-07-26 — End: 2023-09-19

## 2023-07-26 MED ORDER — ONETOUCH ULTRASOFT LANCETS MISC
3 refills | Status: DC
Start: 2023-07-26 — End: 2023-09-19

## 2023-07-26 NOTE — Telephone Encounter (Signed)
 Important Information: Patient was prescribed a mda glucose monitor yesterday, and she needs prescriptions for the lancets and test strips.       Ordering Provider:  Dr. Garwin        Name of medication:Lancet and test strips for glucose monitor      How much medication is remaining: 0        [id number], [place] - [address], AT [facility] [address]  [address] [address]  Phone: [phone] Fax: [phone]       Recent Visits  Date Type Provider Dept   07/25/23 Office Visit Garwin Arnaldo RAMAN, MD [facility]   07/07/23 Office Visit Maryelizabeth Mems, DO [facility]   Showing recent visits within past 540 days with a meds authorizing provider and meeting all other requirements  Future Appointments  Date Type Provider Dept   08/15/23 Appointment Zhao, Sharon, DO [facility]   Showing future appointments within next 150 days with a meds authorizing provider and meeting all other requirements        Medication refill request, see above. Thank you     Patient has been informed that medication refill requests should be called in up to one week prior to running out of medication.                          Medication refill request, see above. Thank you     Patient has been informed that medication refill requests should be called in up to one week prior to running out of medication.

## 2023-07-26 NOTE — Telephone Encounter (Signed)
 Pharmacy must be interpreting "kit" as just the machine and maybe a small amount of other supplies. Can you send an rx for lancets and strips? First time orders have to be placed by a doctor.

## 2023-08-14 DIAGNOSIS — N83201 Unspecified ovarian cyst, right side: Secondary | ICD-10-CM | POA: Insufficient documentation

## 2023-08-14 DIAGNOSIS — E785 Hyperlipidemia, unspecified: Secondary | ICD-10-CM | POA: Insufficient documentation

## 2023-08-14 NOTE — Progress Notes (Signed)
 [facility] [place] - [facility] [place] [facility]                       Date of Exam: 08/15/2023 1:38 PM        Patient ID: Gabriella Dean is a 68 y.o. female.  Attending Physician: Reena Fruits, DO        Chief Complaint:    Chief Complaint   Patient presents with    Annual Exam     GI upset from her metformin , atorvastatin  and lisinopril  RX    Wondering if r/t estradiol , having blurry vision                HPI:    Patient is here for chronic care follow up    She initially seen by me in 06/2023 after not being seen at our clinic since 2023 by Dr. Florene.  Lab work done at that visit showed elevated A1c to 10.4.  She was seen for follow-up by Dr. Garwin on 07/25/2023, reported taking metformin  500 mg twice daily at that time.  Was not able to tolerate higher dose due to GI upset.  Discussed recommendation to start insulin which she declined.  She was started on atorvastatin  20 mg daily lisinopril  5 mg daily.    #T2DM, uncontrolled  -Medication regimen: Metformin  XR 500 mg twice daily-however feels that this is causing significant GI side effects.  She is having nausea, abdominal cramping and profuse watery diarrhea.  When she was on metformin  in the past she denies having these issues  -Statin: Atorvastatin  20 mg daily  -Renal protection: Lisinopril  5 mg daily  -Eye exam: having issues with blurry vision, follows with ophthalmologist  -Immunizations Due: Tdap, Shingles, and Flu, prevnar-declines all vaccinations today she is traveling out of town for her birthday celebration this weekend  -Reported Diet: Compliant with recommended diet- tries to eat healthy, toast in the morning, salads for lunch or other healthy options, salmon/chicken for dinner with vegetables, does recognize she has trouble with portion control     #HTN  -Blood pressure elevated in office today 162/92  -Medication: Lisinopril  5 mg daily (started last visit)-endorses compliance with medication regimen  -Denies chest  pain, lightheadedness, dizziness    #Vaginal dryness  -Has been using vaginal Estrace  with improvement in vaginal dryness    #Uterine fibroids  #Bilateral complex ovarian cysts  -Known history of this since 2021 when a pelvic ultrasound was done for postmenopausal bleeding  Most recent transvaginal ultrasound done 07/2023:  1. Multiple uterine fibroids.  2. Bilateral complex ovarian cysts measuring up to 2.6 cm in the left ovary  and 2.7 cm in the right ovary. Further characterization on pelvic MRI is  recommended.  -Denies any symptoms of abdominal pain or further abnormal uterine bleeding  -Is not established with OB/GYN            Problem List:    Problem List[1]          Current Meds:    Medications Taking[2]       Allergies:    Allergies[3]          Past Surgical History:    Past Surgical History[4]        Family History:    Family History[5]        Social History:    Social History[6]        The following sections were reviewed this encounter by the provider:  Vital Signs:    BP (!) 162/92 (BP Site: Left arm, Patient Position: Sitting)   Pulse 98   Temp 98.2 F (36.8 C)   Ht 1.778 m (5' 10)   Wt 131.9 kg (290 lb 12.8 oz)   BMI 41.73 kg/m          ROS:    ROS as noted in HPI          Physical Exam:    Physical Exam  Vitals and nursing note reviewed.   Constitutional:       Appearance: Normal appearance.   HENT:      Head: Normocephalic and atraumatic.      Mouth/Throat:      Mouth: Mucous membranes are moist.   Eyes:      Extraocular Movements: Extraocular movements intact.      Pupils: Pupils are equal, round, and reactive to light.   Musculoskeletal:         General: Normal range of motion.      Cervical back: Normal range of motion.   Skin:     General: Skin is warm and dry.   Neurological:      General: No focal deficit present.      Mental Status: She is alert and oriented to person, place, and time. Mental status is at baseline.              Assessment/Plan:    1. Type 2 diabetes mellitus  with hyperglycemia, without long-term current use of insulin  - empagliflozin  (JARDIANCE ) 10 MG tablet; Take 1 tablet (10 mg) by mouth daily  Dispense: 30 tablet; Refill: 1  - semaglutide  (OZEMPIC ) 2 MG/3ML; Inject 0.25 mg into the skin once a week  Dispense: 3 mL; Refill: 1  - Referral to Diabetic Education (INTERNAL); Future    Uncontrolled type 2 diabetes with last A1c greater than 10 with microalbuminuria in 06/2023 after being off of metformin  for period of time and lost to follow-up.  She declines insulin treatment.  Last visit we tried starting for back on metformin  XR however she is not able to tolerate the medication due to significant GI distress, nausea abdominal pain and diarrhea.  Will therefore have patient's stop metformin  and start Jardiance  daily and Ozempic  as she also has comorbid medical problems of class III obesity BMI 41.  Handouts provided to patient regarding these medications.  Discussed most common side effects.  She does not have any contraindications to GLP-1 therapy.  Will also refer patient to diabetes education and nutritional counseling.  She should follow-up with her eye doctor for routine eye exam and retinal exam.  She is due for several vaccinations however declines today.    Patient did voice frustration and feeling overwhelmed with diagnosis and various medication adjustments.  Will benefit from frequent visits and slow adjustments to medications.    2. Essential hypertension  - lisinopril  (ZESTRIL ) 10 MG tablet; Take 1 tablet (10 mg) by mouth daily  Dispense: 30 tablet; Refill: 1    Blood pressure elevated above goal.  Will increase lisinopril  to 10 mg daily.  Advised patient to check ambulatory blood pressure readings at home and record these values on a log.    3. Class 3 severe obesity with body mass index (BMI) of 40.0 to 44.9 in adult, unspecified obesity type, unspecified whether serious comorbidity present  - semaglutide  (OZEMPIC ) 2 MG/3ML; Inject 0.25 mg into the skin  once a week  Dispense: 3 mL; Refill: 1  4. Mixed hyperlipidemia  Continue atorvastatin  20 mg daily.  Will need to be titrated to high intensity dose at future visit given diagnosis of type 2 diabetes.    5. Cysts of both ovaries  6. Uterine leiomyoma, unspecified location  - Referral to Obstetrics/Gynecology ([employer]); Future    Repeat pelvic ultrasound from 07/2023 demonstrates stable appearing also persistent bilateral complex ovarian cysts.  They are pretty significant in size, although decreased in size from last check, with the largest one on the left side measuring 2.6 x 1.8 cm.  She denies any symptoms of abdominal pain from the ovarian cyst.  Does occasionally have vaginal bleeding likely from uterine fibroids.  Will refer patient to OB/GYN to discuss further management-monitoring versus intervention.    7. Immunization due  Due for Tdap, Shingles, and Flu, prevnar- declines today          Follow-up:    Return in about 5 weeks (around 09/19/2023) for T2DM follow up.         Reena Fruits, DO                     [1]   Patient Active Problem List  Diagnosis    Type 2 diabetes mellitus without complication, without long-term current use of insulin    Multinodular goiter    Mixed hyperlipidemia    Eczema    Seasonal allergies    Morbid obesity    Body mass index 40.0-44.9, adult    Hyperlipidemia    Cysts of both ovaries    Class 3 severe obesity with body mass index (BMI) of 40.0 to 44.9 in adult   [2]   Outpatient Medications Marked as Taking for the 08/15/23 encounter (Office Visit) with Bobie Caris, DO   Medication Sig Dispense Refill    atorvastatin  (LIPITOR) 20 MG tablet Take 1 tablet (20 mg) by mouth daily 90 tablet 3    estradiol  (ESTRACE ) 0.1 MG/GM vaginal cream Apply 1g intravaginally once daily for 1 week followed by 1g intravaginally 3 times a week 42.5 g 3    metFORMIN  (GLUCOPHAGE -XR) 500 MG 24 hr tablet Take 1 tablet (500 mg) by mouth 2 (two) times daily with meals If tolerating well without any side  effects in one week, increase dose to 2 tablets (1000 mg) two times a day 90 tablet 3    Omega-3 Fatty Acids (FISH OIL PO) Take 1,000 mg by mouth every other day      Vitamin D, Cholecalciferol, 50 MCG (2000 UT) Cap Take 4,000 IU by mouth every morning      [DISCONTINUED] lisinopril  (ZESTRIL ) 5 MG tablet Take 1 tablet (5 mg) by mouth daily 90 tablet 3   [3]   Allergies  Allergen Reactions    Ampicillin Nausea And Vomiting and Rash    Chlor-Trimeton [Chlorpheniramine] Nausea And Vomiting and Rash    Erythromycin Nausea And Vomiting and Rash    Penicillins Nausea And Vomiting and Rash    Phenergan [Promethazine] Nausea And Vomiting    Tetracycline Nausea And Vomiting and Rash   [4]   Past Surgical History:  Procedure Laterality Date    CYST REMOVAL Left     Preauricular Left Ear cyst removal    D & C  1976    bleeding    D & C  1988    miscarriage    THYROIDECTOMY, TOTAL Left 02/11/2021    Procedure: LEFT THYROIDECTOMY;  Surgeon: Victorine Rosina MATSU, MD;  Location: [facility];  Service: ENT;  Laterality: Left;    TONSILLECTOMY AND ADENOIDECTOMY  1967    WISDOM TOOTH EXTRACTION  1971   [5]   Family History  Problem Relation Name Age of Onset    COPD Mother          Smoker    Hypertension Mother      Lung cancer Father      Cancer Father          tumor in chest, unknown type    Diabetes Father      Diabetes Sister      Diabetes Brother      Stroke Brother      Prostate cancer Brother      Diabetes Brother      Heart disease Brother      Stent Brother      Cerebral aneurysm Paternal Grandmother      Lung cancer Maternal Grandfather      Cancer Maternal Grandfather          unknown type    Coronary artery disease Maternal Grandmother     [6]   Social History  Tobacco Use    Smoking status: Never    Smokeless tobacco: Never   Vaping Use    Vaping status: Never Used   Substance Use Topics    Alcohol use: Yes     Comment: socially    Drug use: Never

## 2023-08-15 ENCOUNTER — Ambulatory Visit (INDEPENDENT_AMBULATORY_CARE_PROVIDER_SITE_OTHER): Payer: BLUE CROSS/BLUE SHIELD

## 2023-08-15 VITALS — BP 162/92 | HR 98 | Temp 98.2°F | Ht 70.0 in | Wt 290.8 lb

## 2023-08-15 DIAGNOSIS — Z0000 Encounter for general adult medical examination without abnormal findings: Secondary | ICD-10-CM

## 2023-08-15 DIAGNOSIS — E1165 Type 2 diabetes mellitus with hyperglycemia: Secondary | ICD-10-CM

## 2023-08-15 DIAGNOSIS — E66813 Obesity, class 3: Secondary | ICD-10-CM | POA: Insufficient documentation

## 2023-08-15 DIAGNOSIS — Z23 Encounter for immunization: Secondary | ICD-10-CM

## 2023-08-15 DIAGNOSIS — Z6841 Body Mass Index (BMI) 40.0 and over, adult: Secondary | ICD-10-CM

## 2023-08-15 DIAGNOSIS — N83201 Unspecified ovarian cyst, right side: Secondary | ICD-10-CM

## 2023-08-15 DIAGNOSIS — I10 Essential (primary) hypertension: Secondary | ICD-10-CM

## 2023-08-15 DIAGNOSIS — N83202 Unspecified ovarian cyst, left side: Secondary | ICD-10-CM

## 2023-08-15 DIAGNOSIS — E782 Mixed hyperlipidemia: Secondary | ICD-10-CM

## 2023-08-15 DIAGNOSIS — D259 Leiomyoma of uterus, unspecified: Secondary | ICD-10-CM

## 2023-08-15 MED ORDER — EMPAGLIFLOZIN 10 MG PO TABS
10.0000 mg | ORAL_TABLET | Freq: Every day | ORAL | 1 refills | Status: DC
Start: 2023-08-15 — End: 2023-10-09

## 2023-08-15 MED ORDER — LISINOPRIL 10 MG PO TABS
10.0000 mg | ORAL_TABLET | Freq: Every day | ORAL | 1 refills | Status: DC
Start: 2023-08-15 — End: 2023-09-19

## 2023-08-15 MED ORDER — SEMAGLUTIDE(0.25 OR 0.5MG/DOS) 2 MG/3ML SC SOPN
0.2500 mg | PEN_INJECTOR | SUBCUTANEOUS | 1 refills | Status: DC
Start: 2023-08-15 — End: 2023-09-19

## 2023-08-15 NOTE — Progress Notes (Signed)
Patient seen and discussed with Sharon Zhao, DO.  Physically present for their exam, pertinent portions directly duplicated and agree with their findings.  Chart reviewed to include allergies, problems, current medications and family, social and past medical and surgical histories.  I reviewed the history, and review of systems  as outlined in Sharon Zhao, DO's note and I agree with the findings as outlined in their note.  We discussed the assessment and plan for the encounter in detail and I concur with the assessment and plan as per Sharon Zhao, DO .

## 2023-08-17 ENCOUNTER — Encounter (INDEPENDENT_AMBULATORY_CARE_PROVIDER_SITE_OTHER): Payer: Self-pay

## 2023-08-17 NOTE — Progress Notes (Signed)
 PA submitted  (Key: [id number])    PA approved

## 2023-08-25 ENCOUNTER — Telehealth (INDEPENDENT_AMBULATORY_CARE_PROVIDER_SITE_OTHER): Payer: Self-pay

## 2023-08-25 NOTE — Telephone Encounter (Signed)
 Important Information: Dr. Zhao, the patient called and stated that her last visit, you informed her that you were sending in a prescription for jardiance .  However, her pharmacy has yet to received the prescription.      If you are still going to prescribe her this medication, please send to the pharmacy listed below and let the patient know when she can pick up?       Ordering Provider:  Dr. Maryelizabeth    Name of medication:Jardiance        How much medication is remaining: New         [facility] [address] [place], [place] - [address], AT [facility] [address]  [address] [place] [address]  Phone: [phone] Fax: [phone]       Recent Visits  Date Type Provider Dept   08/15/23 Office Visit Maryelizabeth Mems, DO [facility]   07/25/23 Office Visit Garwin Arnaldo RAMAN, MD [facility]   07/07/23 Office Visit Maryelizabeth Mems, DO [facility]   Showing recent visits within past 540 days with a meds authorizing provider and meeting all other requirements  Future Appointments  Date Type Provider Dept   09/19/23 Appointment [place] Ozell SAUNDERS, MD [facility]   10/05/23 Appointment Maryelizabeth Mems, DO [facility]   Showing future appointments within next 150 days with a meds authorizing provider and meeting all other requirements        Medication refill request, see above. Thank you     Patient has been informed that medication refill requests should be called in up to one week prior to running out of medication.

## 2023-08-25 NOTE — Telephone Encounter (Signed)
 Confirmed with pharmacy rx is available and pt notified

## 2023-09-07 NOTE — Progress Notes (Deleted)
 Knowledge and Learning Needs Assessment:    Ratings:   1 = needs instruction  2 = needs review  3 = comprehends key points  4 = demonstrates competency  N/a = not assessed    Knowledge of diabetes disease process: ***  Nutrition Management: ***  Understanding of Physical Activity: ***  Medication Use: ***  Monitoring and Using Results: ***  Preventing Acute Complications: ***  Psychosocial Adjustment: ***  Behavior Change Strategies: ***  Risk Reduction Strategies: ***      Met with Gabriella Dean for initial assessment of Diabetes after being referred by Edilia Gordon, DO for   Diabetes Self Management Education (Assessment and up to 9 hours of group education)  and Medical Nutrition Therapy (MNT).   Pt is accompanied by : patient  Latest labs: 06/2023,A1C was 10.4%.  If provided:    Reviewed Lipid panel: yes    Reviewed GFR: yes      Medication/allergies/immunization has been reviewed: {YES/NO:22438}      How would you rate your overall health? *** (excellent, good, fair, poor)    History with diabetes:  Diagnosed with Type 2 diabetes  ***.  Previous diabetes education? {YES/NO:22438}  Where if stating yes?    Medications for diabetes: Currently taking Jardiance  daily and Ozempic     How often do you miss taking medications as prescribed? ***  General aspects of common diabetes medications reviewed including:  Treatment plan  Medications and where they work in the body    Monitoring:  Monitoring blood glucose was reviewed and included:  *** meter was given to patient at this visit. SMBG skills reviewed.   Patient return demonstrated proper use of a glucometer to this provider.   *** Patient already has a meter and is testing ***.  FBG: ***  Post Prandial BG: ***  TIR***    Advised to call *** doctor for prescription for more testing supplies. Patient verbalized understanding.     Blood glucose target ranges discussed with patient.   Hyperglycemia (350 or more) {YES/NO:22438}  ***How often  Hypoglycemia  {YES/NO:22438}  ***How often?   ***If yes, how do you treat your hypoglycemia    Meaningful testing was explained  Encouraged patient to start to check BG daily fasting, and to test pre and 2 hours post start of largest meal ; 3 days a week.  Importance of keeping and recording blood glucose levels was discussed   Review A1C goal     Exercise:    What if any barriers do you have to physical activity? ***  Currently patient is *** for *** minutes.      Nutrition:   Special dietary/cultural or religious needs or restrictions? *** {YES/NO:22438} If yes, explain ***  Patient reports since the diagnosis with diabetes, some changes to diet have included: ***.   Do you read food labels? {YES/NO:22438}  The Healthy Plate Method was briefly explained.     Risk reduction:  Blood pressure check within the last 12 months: {YES/NO:22438}   Dilated eye exam within the last 12 months: {YES/NO:22438}  Diabetic foot exam within the last 12 months: {YES/NO:22438}   Dental visit within the last 6 months: {YES/NO:22438}   Immunizations:  Flu? {YES/NO:22438}  Pneumonia {YES/NO:22438}  Shingles? {YES/NO:22438}  Covid? {YES/NO:22438}    Counseling was provided to the patient on the benefits of yearly-dilated eye exams and diabetic foot exams and regular dental exams  in the prevention of diabetes-related complications.  Living with Diabetes  Is there anything about diabetes that causes you stress or distress? {YES/NO:22438} explain if yes: ***  How do you deal with stress/distress? ***    Discussion:  Patient created a behavior change goal today of: ***.   Potential barriers to learning or success: {Barriers to Learning:26975}  Patient learns best: Scientist, research (physical sciences), verbal discussion, video, visual  The Educator has assessed their overall outcome of learning so far to be :{PSYCH LEARNING INTERVENTION:23678}  LWWD class packet provided to patient. Class 1, 2 and 3 curriculum discussed with patient.      Patient was  encouraged to email/call this provider with any concerns or questions.     Plan: Patient is scheduled for the following visits:  LWWD Classes 1, 2 and 3  *** Individual DSMT/E  *** MNT follow up with an RD

## 2023-09-08 ENCOUNTER — Ambulatory Visit (HOSPITAL_BASED_OUTPATIENT_CLINIC_OR_DEPARTMENT_OTHER)

## 2023-09-08 ENCOUNTER — Encounter (INDEPENDENT_AMBULATORY_CARE_PROVIDER_SITE_OTHER): Payer: Self-pay

## 2023-09-15 ENCOUNTER — Other Ambulatory Visit (INDEPENDENT_AMBULATORY_CARE_PROVIDER_SITE_OTHER): Payer: Self-pay

## 2023-09-15 DIAGNOSIS — I10 Essential (primary) hypertension: Secondary | ICD-10-CM

## 2023-09-18 ENCOUNTER — Encounter: Payer: Self-pay | Admitting: Obstetrics & Gynecology

## 2023-09-19 ENCOUNTER — Encounter (INDEPENDENT_AMBULATORY_CARE_PROVIDER_SITE_OTHER): Payer: Self-pay

## 2023-09-19 ENCOUNTER — Ambulatory Visit (INDEPENDENT_AMBULATORY_CARE_PROVIDER_SITE_OTHER): Payer: BLUE CROSS/BLUE SHIELD

## 2023-09-19 ENCOUNTER — Encounter: Payer: Self-pay | Admitting: Obstetrics & Gynecology

## 2023-09-19 VITALS — BP 142/89 | HR 78 | Temp 97.5°F | Ht 70.0 in | Wt 289.6 lb

## 2023-09-19 DIAGNOSIS — Z6841 Body Mass Index (BMI) 40.0 and over, adult: Secondary | ICD-10-CM

## 2023-09-19 DIAGNOSIS — E1165 Type 2 diabetes mellitus with hyperglycemia: Secondary | ICD-10-CM

## 2023-09-19 DIAGNOSIS — E66813 Obesity, class 3: Secondary | ICD-10-CM

## 2023-09-19 DIAGNOSIS — H538 Other visual disturbances: Secondary | ICD-10-CM

## 2023-09-19 DIAGNOSIS — I10 Essential (primary) hypertension: Secondary | ICD-10-CM

## 2023-09-19 LAB — HEMOGLOBIN A1C
Average Estimated Glucose: 168.6 mg/dL
Hemoglobin A1C: 7.5 % — ABNORMAL HIGH (ref 4.6–5.6)

## 2023-09-19 MED ORDER — SEMAGLUTIDE(0.25 OR 0.5MG/DOS) 2 MG/3ML SC SOPN
0.5000 mg | PEN_INJECTOR | SUBCUTANEOUS | 5 refills | Status: AC
Start: 2023-09-19 — End: ?

## 2023-09-19 MED ORDER — LISINOPRIL 20 MG PO TABS
20.0000 mg | ORAL_TABLET | Freq: Every day | ORAL | 3 refills | Status: AC
Start: 2023-09-19 — End: ?

## 2023-09-19 NOTE — Progress Notes (Signed)
 Patient seen and discussed with Arrie Aran, MD.  Physically present for their exam, pertinent portions directly duplicated and agree with their findings.  Chart reviewed to include allergies, problems, current medications and family, social and past medical and surgical histories.  I reviewed the history, and review of systems  as outlined in Arrie Aran, MD's note and I agree with the findings as outlined in their note.  We discussed the assessment and plan for the encounter in detail and I concur with the assessment and plan as per Arrie Aran, MD.

## 2023-09-19 NOTE — Progress Notes (Addendum)
 [facility] [place] [facility] [place] [facility]           Subjective     History of Present Illness  The patient, with a history of diabetes, presents with concerns about her diabetes management and new symptoms of blurry vision and vaginal discomfort. She reports a significant increase in her A1c from 6.2 two years ago to 10 in January. She expresses frustration with the inconsistency in care due to seeing multiple providers, each with different treatment plans.    The patient also reports new onset blurry vision that started in February after initiating estradiol  for vaginal dryness. The blurry vision occurs daily, predominantly in the morning and occasionally in the evening. She denies any peripheral vision loss or floaters.    In addition to the blurry vision, the patient has been experiencing vaginal discomfort since January. She describes a burning sensation and tenderness, exacerbated by urination and water from showering. She also reports a sensation of a split in the vulva, which becomes more pronounced when she opens her legs. She denies any discharge, odor, or itching. The patient started estradiol  and metformin  in January, and lisinopril  was added later for high blood pressure. The lisinopril  dose was increased from 5mg  to 10mg  due to inadequate blood pressure control.    The patient also reports high cholesterol and being overweight. She started Jardiance  after March 4th. She denies any sexual activity since January.    Review of Systems   Constitutional:  Negative for activity change, appetite change, diaphoresis, fatigue and fever.   Respiratory:  Negative for cough, chest tightness, shortness of breath and wheezing.    Cardiovascular:  Negative for chest pain, palpitations and leg swelling.   Gastrointestinal:  Negative for abdominal distention and abdominal pain.   Genitourinary:  Positive for dyspareunia, dysuria and genital sores. Negative for difficulty urinating, flank pain, frequency,  hematuria, menstrual problem, pelvic pain, urgency, vaginal bleeding and vaginal discharge.   Skin:  Negative for color change.   Neurological:  Positive for light-headedness. Negative for dizziness and headaches.   Psychiatric/Behavioral:  Negative for agitation, behavioral problems and confusion. The patient is not nervous/anxious.        Objective   BP 142/89 (BP Site: Left arm, Patient Position: Sitting)   Pulse 78   Temp 97.5 F (36.4 C)   Ht 1.778 m (5\' 10" )   Wt 131.4 kg (289 lb 9.6 oz)   BMI 41.55 kg/m   Physical Exam  Exam conducted with a chaperone present.   Constitutional:       General: She is not in acute distress.     Appearance: Normal appearance. She is not ill-appearing, toxic-appearing or diaphoretic.   HENT:      Head: Normocephalic and atraumatic.      Nose: Nose normal.   Eyes:      Extraocular Movements: Extraocular movements intact.   Pulmonary:      Effort: Pulmonary effort is normal. No respiratory distress.   Genitourinary:     Urethra: No prolapse or urethral lesion.          Comments: 2x <1 mm areas of open skin over 3-4 mm area of atropic skin  No plaques   No discharge   Normal vaginal wall and cervix   Neurological:      Mental Status: She is alert.       Physical Exam       Results  LABS  A1c: 10 (06/2023)  Assessment/Plan     Assessment & Plan  Vulvovaginal discomfort  Experiencing burning and tenderness, initially relieved by estradiol , symptoms returned. Differential includes vaginal dryness, UTI, or dermatological conditions.  - Perform physical examination of vulvovaginal area.  - Start Replens daily or at least five days a week.  - Resume estradiol  three times a week, apply Replens first if estradiol  causes burning.  - Consider oral estrogen if topical treatments ineffective.    Blurry vision  Blurry vision started after estradiol , persists despite stopping. Differential includes estradiol  side effects, blood pressure fluctuations, or ocular conditions.  - Refer to  ophthalmology for evaluation.  - Monitor blood pressure during blurry vision episodes, record readings.    Type 2 Diabetes Mellitus  A1c increased from 6.2 to 10.0, indicating poor glycemic control. Blurry vision could be related to uncontrolled diabetes.  - Increase Ozempic  dose to 0.5 mg weekly   - Continue metformin  and jardiance      Hypertension  Blood pressure not controlled on lisinopril  10 mg. Blurry vision may relate to blood pressure fluctuations.  - Increase lisinopril  to 20 mg daily.  - Monitor blood pressure daily and during blurry vision episodes, record readings.  - Seek emergency care if blood pressure exceeds 180 mmHg with symptoms.      Verbal consent obtained to record this visit.

## 2023-09-21 ENCOUNTER — Other Ambulatory Visit: Payer: Self-pay | Admitting: Otolaryngology

## 2023-10-02 ENCOUNTER — Ambulatory Visit (INDEPENDENT_AMBULATORY_CARE_PROVIDER_SITE_OTHER): Payer: Self-pay

## 2023-10-05 ENCOUNTER — Encounter (INDEPENDENT_AMBULATORY_CARE_PROVIDER_SITE_OTHER): Payer: Self-pay

## 2023-10-05 ENCOUNTER — Ambulatory Visit (INDEPENDENT_AMBULATORY_CARE_PROVIDER_SITE_OTHER): Payer: BLUE CROSS/BLUE SHIELD

## 2023-10-05 NOTE — Progress Notes (Unsigned)
 [facility] [place] [facility] [place] [facility]     {Vanishing Tip Click a link below to be taken to that activity or part of the chart   Chart Review  Order Review  Review Flowsheets  Labs  Health Maintenance  Immunizations  Allergies  Medications  Problem List  History  Synopsis :55325}      Subjective   No chief complaint on file.    History of Present Illness    Review of Systems    Objective   There were no vitals taken for this visit.  Physical Exam  Physical Exam       Results        Assessment/Plan     Assessment & Plan      Verbal consent obtained to record this visit.

## 2023-10-06 ENCOUNTER — Other Ambulatory Visit (INDEPENDENT_AMBULATORY_CARE_PROVIDER_SITE_OTHER): Payer: Self-pay

## 2023-10-06 DIAGNOSIS — E1165 Type 2 diabetes mellitus with hyperglycemia: Secondary | ICD-10-CM

## 2023-10-08 DIAGNOSIS — I10 Essential (primary) hypertension: Secondary | ICD-10-CM | POA: Insufficient documentation

## 2023-10-08 NOTE — Progress Notes (Signed)
 [facility] [place] - [facility] [place] [facility]           Subjective     Chief Complaint   Patient presents with    Diabetes Follow-up     History of Present Illness    Gabriella Dean is a 68 year old female who presents for chronic care.    She continues to experience persistent vaginal dryness and discomfort.The sensation is described as 'little tears' with burning during urination. She has not been sexually active since February, and her partner resides in [place] . She is concerned about the impact of these symptoms on her life and with intimacy.     She has been using estradiol  and Replens for treatment. Initially, estradiol  provided relief when used more frequently, but now she uses it every other day or three times a week. Replens, used every three days, provides some relief, but significant discomfort is noted on non-treatment days. She has not seen significant improvement with these treatments and expresses frustration with the ongoing symptoms. She attempted to see a gynecologist, but did not proceed with the appointment due to payment issues with the office and is in the process of establishing with another gynecologist.     She is currently on multiple medications, including estradiol , Replens, atorvastatin , lisinopril , Ozempic , and Jardiance . She reports muscle aches and tingling sensations, particularly at night, which she associates with atorvastatin  use. The leg pain is described as similar to 'growing pains' and rated as a five out of ten in severity.    She has experienced weight loss, noting a decrease from 299 pounds in February to 285 pounds currently. She attributes this to her medication regimen, including Ozempic , which she has been taking as instructed.    #Type 2 diabetes: Last A1C 7.5 on 09/19/2023 (from 10.4 in 06/2023).  Was not able to tolerate metformin  extended release even low-dose due to GI side effects.  Is now currently on Jardiance  10 mg daily and Ozempic  0.5 mg  weekly  #Hypertension on lisinopril  20 mg daily  #Hyperlipidemia on atorvastatin  20 mg daily  #GERD on Protonix 40mg  daily  #Atrophic vaginitis: using replens over the counter 3x week alternating with vaginal estrace  3x week    Review of Systems    Objective   BP 123/77   Pulse 79   Temp 97.7 F (36.5 C)   Resp 16   Wt 129.4 kg (285 lb 3.2 oz)   BMI 40.92 kg/m   Physical Exam  Vitals and nursing note reviewed.   Constitutional:       Appearance: Normal appearance.   HENT:      Head: Normocephalic and atraumatic.      Mouth/Throat:      Mouth: Mucous membranes are moist.   Eyes:      Extraocular Movements: Extraocular movements intact.      Pupils: Pupils are equal, round, and reactive to light.   Musculoskeletal:         General: Normal range of motion.      Cervical back: Normal range of motion.   Skin:     General: Skin is warm and dry.   Neurological:      General: No focal deficit present.      Mental Status: She is alert and oriented to person, place, and time. Mental status is at baseline.       Physical Exam       Results        Assessment/Plan  1. Atrophic vaginitis    2. Type 2 diabetes mellitus without complication, without long-term current use of insulin (CMS/HCC)  - empagliflozin  (JARDIANCE ) 10 MG tablet; Take 1 tablet (10 mg) by mouth once daily  Dispense: 90 tablet; Refill: 3    3. Primary hypertension    4. Mixed hyperlipidemia  - rosuvastatin  (CRESTOR ) 10 MG tablet; Take 1 tablet (10 mg) by mouth once daily  Dispense: 90 tablet; Refill: 0    5. Class 3 severe obesity with body mass index (BMI) of 40.0 to 44.9 in adult, unspecified obesity type, unspecified whether serious comorbidity present    6. Encounter for screening mammogram for malignant neoplasm of breast  - Mammo Digital 2D Screening Bilateral W CAD; Future     Assessment & Plan  Atrophic vaginitis  Persistent vaginal dryness and irritation since January, not fully relieved by current treatment with estradiol  and Replens. Symptoms  include dysuria and sensation of blisters. Previous examination indicated atrophy. Current treatment regimen not providing adequate relief, considering systemic hormone therapy. Discussed using Replens daily and continuing vaginal estrace  3x weekly  - Continue Replens daily and estradiol  intravaginally three times a week.  - Consider systemic hormone therapy if symptoms persist.  - Avoid douching and counseled patient to use lubricants during intercourse.    Type 2 diabetes mellitus with hyperglycemia  Diabetes management includes Ozempic  and Jardiance . Current regimen appears effective with weight reduction from 299 lbs to 285 lbs since February. No adverse effects reported with Jardiance . Discussed potential future increase in Ozempic  dosage for further weight loss and diabetes management and she would like to stay on current dose at this time.  - Continue Ozempic  0.5 mg weekly.  - Continue Jardiance  as prescribed.  - Plan to recheck A1c at next visit    Essential hypertension  Blood pressure is well-controlled on current medication regimen.  - Continue current antihypertensive regimen.    Class 3 severe obesity  Weight has decreased from 299 lbs to 285 lbs since February, indicating positive response to current management. Ozempic  is aiding in weight loss. Discussed potential future increase in Ozempic  dosage for further weight loss.  - Continue Ozempic  0.5 mg weekly.  - Encourage lifestyle modifications for weight management.    Mixed hyperlipidemia  Reports myalgia potentially related to atorvastatin . Plan to switch to a different statin to assess for improvement in symptoms.  - Discontinue atorvastatin  and initiate rosuvastatin  as an alternative statin therapy.    Verbal consent obtained to record this visit.

## 2023-10-09 ENCOUNTER — Ambulatory Visit (INDEPENDENT_AMBULATORY_CARE_PROVIDER_SITE_OTHER): Payer: BLUE CROSS/BLUE SHIELD

## 2023-10-09 VITALS — BP 123/77 | HR 79 | Temp 97.7°F | Resp 16 | Wt 285.2 lb

## 2023-10-09 DIAGNOSIS — Z1231 Encounter for screening mammogram for malignant neoplasm of breast: Secondary | ICD-10-CM

## 2023-10-09 DIAGNOSIS — N952 Postmenopausal atrophic vaginitis: Secondary | ICD-10-CM

## 2023-10-09 DIAGNOSIS — Z23 Encounter for immunization: Secondary | ICD-10-CM

## 2023-10-09 DIAGNOSIS — Z6841 Body Mass Index (BMI) 40.0 and over, adult: Secondary | ICD-10-CM

## 2023-10-09 DIAGNOSIS — E782 Mixed hyperlipidemia: Secondary | ICD-10-CM

## 2023-10-09 DIAGNOSIS — I10 Essential (primary) hypertension: Secondary | ICD-10-CM

## 2023-10-09 DIAGNOSIS — E66813 Obesity, class 3: Secondary | ICD-10-CM

## 2023-10-09 DIAGNOSIS — E119 Type 2 diabetes mellitus without complications: Secondary | ICD-10-CM

## 2023-10-09 MED ORDER — EMPAGLIFLOZIN 10 MG PO TABS
10.0000 mg | ORAL_TABLET | Freq: Every day | ORAL | 3 refills | Status: AC
Start: 2023-10-09 — End: ?

## 2023-10-09 MED ORDER — ROSUVASTATIN CALCIUM 10 MG PO TABS
10.0000 mg | ORAL_TABLET | Freq: Every day | ORAL | 0 refills | Status: AC
Start: 2023-10-09 — End: ?

## 2023-10-09 MED ORDER — SEMAGLUTIDE(0.25 OR 0.5MG/DOS) 2 MG/3ML SC SOPN
0.5000 mg | PEN_INJECTOR | SUBCUTANEOUS | 5 refills | Status: AC
Start: 2023-10-09 — End: ?

## 2023-10-10 ENCOUNTER — Encounter (INDEPENDENT_AMBULATORY_CARE_PROVIDER_SITE_OTHER): Payer: Self-pay

## 2023-10-10 NOTE — Progress Notes (Signed)
Patient seen and discussed with Sharon Zhao, DO.  Physically present for their exam, pertinent portions directly duplicated and agree with their findings.  Chart reviewed to include allergies, problems, current medications and family, social and past medical and surgical histories.  I reviewed the history, and review of systems  as outlined in Sharon Zhao, DO's note and I agree with the findings as outlined in their note.  We discussed the assessment and plan for the encounter in detail and I concur with the assessment and plan as per Sharon Zhao, DO .

## 2023-10-16 IMAGING — OT DXA BONE DENSITY
2 series · 2 of 2 positions shown · non-contrast
Comparison: none

REASON FOR EXAM: Menopausal screening.

RISK FACTORS:  Current history of smoking.
PRIOR EXAMS:  None
METHOD:  Scans of the spine and hip were performed using dual energy X-ray densitometry (DXA)

[Series 1: — · 1 of 1 slices shown (1 of 2)]
[im 1/1]
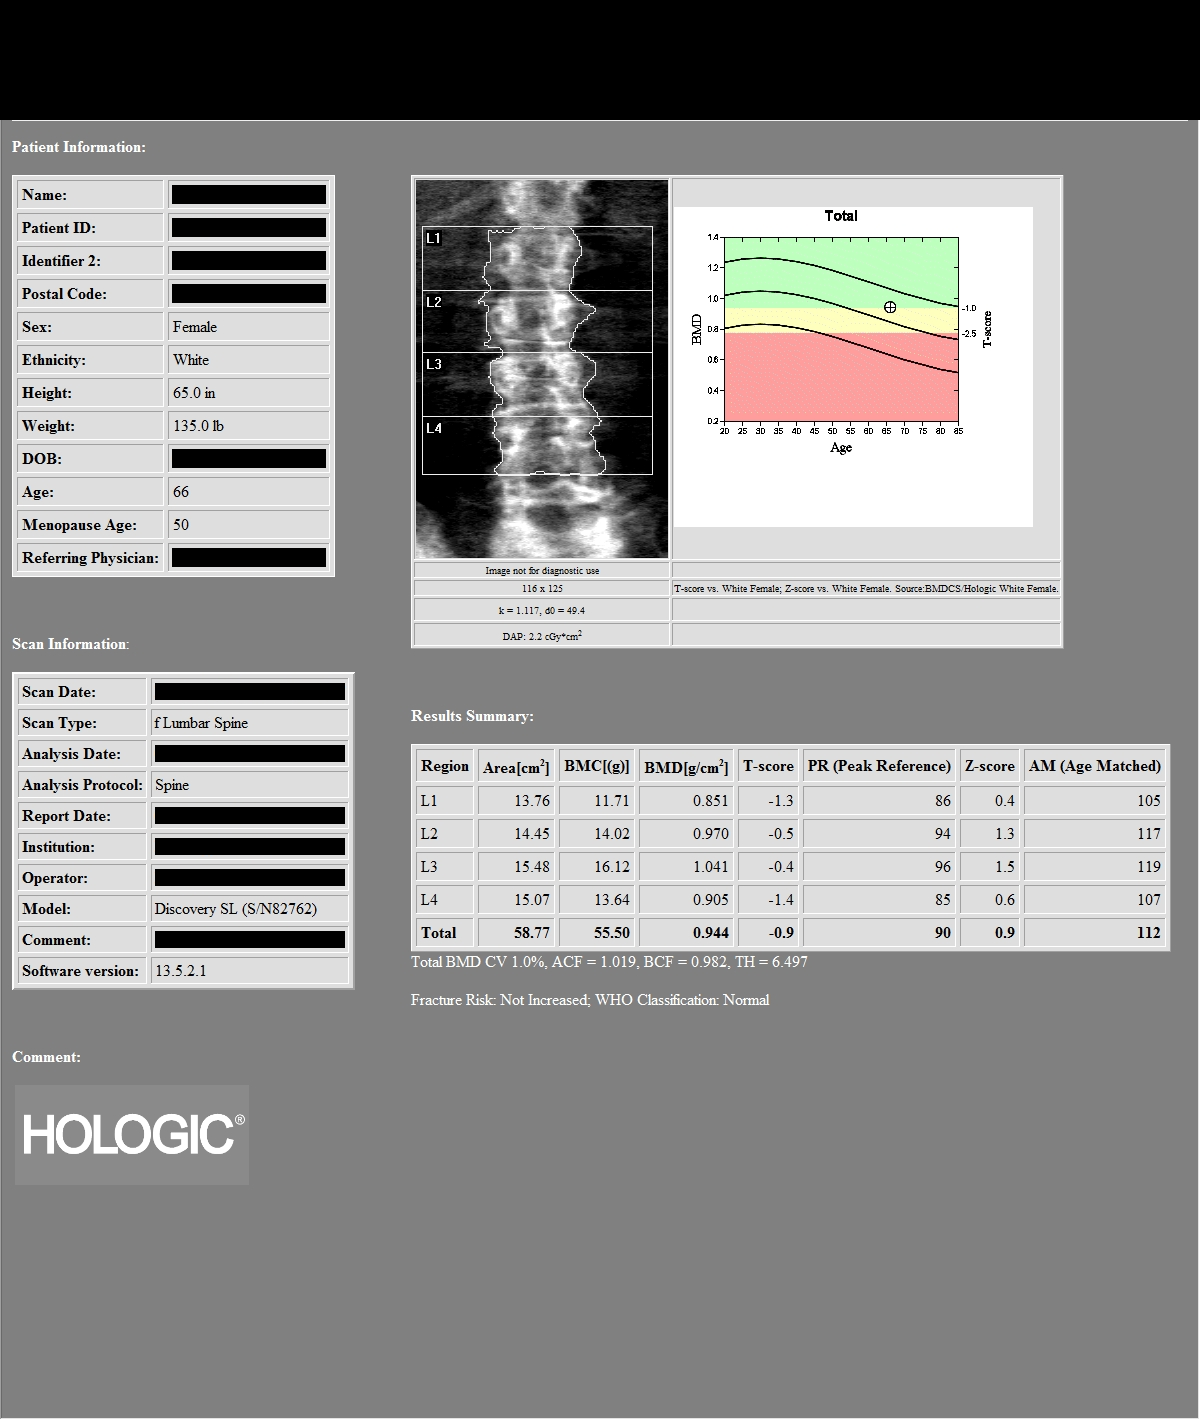

[Series 2: — · left · 1 of 1 slices shown (2 of 2)]
[im 1/1]
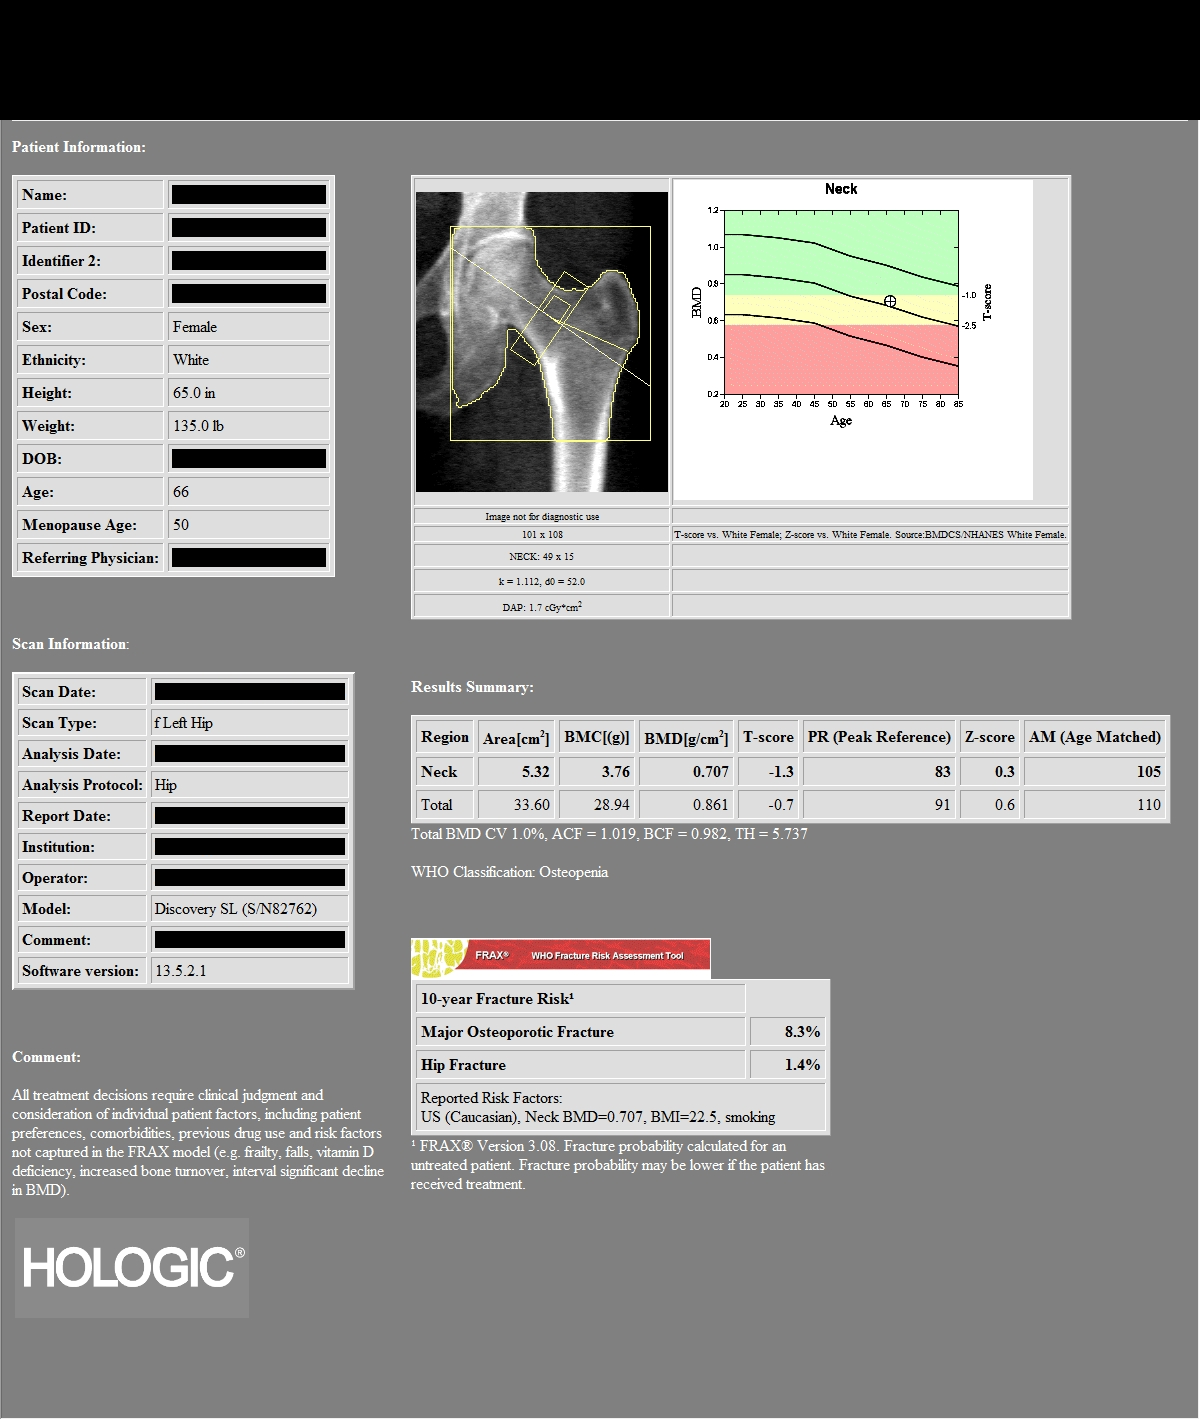

[2 of 2 positions shown; findings below may reference images not displayed]

IMPRESSION: As defined by World Health Organization, the patient meets the criteria for OSTEOPENIA based on hip T-scores.

PATIENT DEMOGRAPHICS:  66 years old White  Female.
FINDINGS: 1.    Review of scanogram images shows no factor invalidating scan results.  

2.    The lumbar spine exam using L1-L4 regions shows average Bone Mineral Density is 0.944 gm/cm2 of Hydroxyapatite.  The T-score (comparing patient with a young adult group) is 0.9 standard deviations BELOW mean. The Z-score (comparing patient with an age-matched group) is 0.9 standard deviations ABOVE mean.

3.  The left hip exam using femoral neck region of interest shows average Bone Mineral Density is 0.707 gm/cm2 of Hydroxyapatite. The T-score (comparing patient with a young adult group) is 1.3 standard deviations BELOW mean. The Z-score (comparing patient with an age-matched group) is 0.3 standard deviations ABOVE mean.

.

According to the World Health Organization risk assessment tool (FRAX) for osteopenia only, which uses the femoral neck T score and includes other patient risk factors for fracture, the patient has a 10-year absolute risk of hip fracture of 1.4% and 10-year absolute risk fracture for any major fracture of 8.3%.

RECOMMENDATIONS: The patient states that she is not taking supplements on a regular basis and regular exercise to patient tolerance would be of benefit. The patient is not taking prescribed medication for prevention of bone loss. According to criteria established by the National Osteoporosis Foundation, the patient DOES NOT meet the current indications for prescribed medical therapy.  The National Osteoporosis Foundation now recommends followup DXA scanning every two years in patients at risk regardless of whether the patient is undergoing pharmacological treatment.

## 2023-10-19 ENCOUNTER — Encounter (INDEPENDENT_AMBULATORY_CARE_PROVIDER_SITE_OTHER): Payer: Self-pay

## 2023-11-02 ENCOUNTER — Ambulatory Visit (HOSPITAL_BASED_OUTPATIENT_CLINIC_OR_DEPARTMENT_OTHER)

## 2023-11-08 DIAGNOSIS — K219 Gastro-esophageal reflux disease without esophagitis: Secondary | ICD-10-CM | POA: Insufficient documentation

## 2023-11-08 NOTE — Progress Notes (Deleted)
 [facility] [place] - [facility] [place] [facility]       {Vanishing Tip Click a link below to be taken to that activity or part of the chart   Chart Review  Order Review  Review Flowsheets  Labs  Health Maintenance  Immunizations  Allergies  Medications  Problem List  History  Synopsis :55325}        Subjective     No chief complaint on file.    History of Present Illness      The ASCVD Risk score (Arnett DK, et al., 2019) failed to calculate for the following reasons:    The smoking status is invalid     Review of Systems    Objective     There were no vitals taken for this visit.  Physical Exam  Vitals and nursing note reviewed.   Constitutional:       Appearance: Normal appearance.   HENT:      Head: Normocephalic and atraumatic.      Mouth/Throat:      Mouth: Mucous membranes are moist.   Eyes:      Extraocular Movements: Extraocular movements intact.      Pupils: Pupils are equal, round, and reactive to light.   Musculoskeletal:         General: Normal range of motion.      Cervical back: Normal range of motion.   Skin:     General: Skin is warm and dry.   Neurological:      General: No focal deficit present.      Mental Status: She is alert and oriented to person, place, and time. Mental status is at baseline.       Physical Exam       Results        Assessment/Plan   1. Type 2 diabetes mellitus without complication, without long-term current use of insulin (CMS/HCC)    2. Class 3 severe obesity with body mass index (BMI) of 40.0 to 44.9 in adult    3. Mixed hyperlipidemia    4. 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

## 2023-11-09 ENCOUNTER — Ambulatory Visit (INDEPENDENT_AMBULATORY_CARE_PROVIDER_SITE_OTHER)

## 2023-11-09 ENCOUNTER — Encounter (INDEPENDENT_AMBULATORY_CARE_PROVIDER_SITE_OTHER): Payer: Self-pay

## 2023-11-10 ENCOUNTER — Other Ambulatory Visit (INDEPENDENT_AMBULATORY_CARE_PROVIDER_SITE_OTHER): Payer: Self-pay

## 2023-11-13 ENCOUNTER — Ambulatory Visit (INDEPENDENT_AMBULATORY_CARE_PROVIDER_SITE_OTHER): Payer: Self-pay

## 2023-11-14 NOTE — Telephone Encounter (Signed)
 Message left on vm to schedule visit with Dr Zhao

## 2023-11-16 ENCOUNTER — Ambulatory Visit (HOSPITAL_BASED_OUTPATIENT_CLINIC_OR_DEPARTMENT_OTHER)

## 2023-11-23 ENCOUNTER — Ambulatory Visit (HOSPITAL_BASED_OUTPATIENT_CLINIC_OR_DEPARTMENT_OTHER)

## 2023-12-26 ENCOUNTER — Encounter (INDEPENDENT_AMBULATORY_CARE_PROVIDER_SITE_OTHER): Payer: Self-pay

## 2024-05-03 ENCOUNTER — Other Ambulatory Visit (INDEPENDENT_AMBULATORY_CARE_PROVIDER_SITE_OTHER): Payer: Self-pay

## 2024-05-03 DIAGNOSIS — E1165 Type 2 diabetes mellitus with hyperglycemia: Secondary | ICD-10-CM

## 2024-05-08 ENCOUNTER — Telehealth (INDEPENDENT_AMBULATORY_CARE_PROVIDER_SITE_OTHER): Payer: Self-pay

## 2024-05-08 NOTE — Telephone Encounter (Signed)
 Important Information: Next appt is on 06/06/24.      Ordering Provider:  Dr. Elson    Name of medication:Ozempic      How much medication is remaining: 0        [facility] [address] [place], [place] - [address], AT [facility]  [address] [place] [address]  Phone: [phone] Fax: [phone]       Recent Visits  Date Type Provider Dept   10/09/23 Office Visit Maryelizabeth Mems, DO [facility]   09/19/23 Office Visit Wadell Ozell SAUNDERS, MD [facility]   08/15/23 Office Visit Maryelizabeth Mems, DO [facility]   07/25/23 Office Visit Garwin Arnaldo RAMAN, MD [facility]   07/07/23 Office Visit Zhao, Sharon, DO [facility]   Showing recent visits within past 540 days with a meds authorizing provider and meeting all other requirements  Future Appointments  Date Type Provider Dept   05/17/24 Appointment Elson Mallick, MD [facility]   07/16/24 Appointment Everitt Clint Kill, [place], DO [facility]   Showing future appointments within next 150 days with a meds authorizing provider and meeting all other requirements        Medication refill request, see above. Thank you     Patient has been informed that medication refill requests should be called in up to one week prior to running out of medication.

## 2024-05-08 NOTE — Telephone Encounter (Signed)
 Pt has an appointment on 12/5

## 2024-05-17 ENCOUNTER — Ambulatory Visit (INDEPENDENT_AMBULATORY_CARE_PROVIDER_SITE_OTHER)

## 2024-07-16 ENCOUNTER — Ambulatory Visit (INDEPENDENT_AMBULATORY_CARE_PROVIDER_SITE_OTHER): Admitting: Student in an Organized Health Care Education/Training Program
